# Patient Record
Sex: Male | Born: 1970 | Race: White | Hispanic: No | Marital: Married | Smoking: Former smoker
Health system: Southern US, Community
[De-identification: ages and names within clinical notes are randomized; demographics above are authoritative.]

## PROBLEM LIST (undated history)

## (undated) DIAGNOSIS — I509 Heart failure, unspecified: Secondary | ICD-10-CM

## (undated) DIAGNOSIS — M1990 Unspecified osteoarthritis, unspecified site: Secondary | ICD-10-CM

## (undated) DIAGNOSIS — J45909 Unspecified asthma, uncomplicated: Secondary | ICD-10-CM

## (undated) DIAGNOSIS — I219 Acute myocardial infarction, unspecified: Secondary | ICD-10-CM

## (undated) DIAGNOSIS — I2510 Atherosclerotic heart disease of native coronary artery without angina pectoris: Secondary | ICD-10-CM

## (undated) DIAGNOSIS — E785 Hyperlipidemia, unspecified: Secondary | ICD-10-CM

## (undated) DIAGNOSIS — K279 Peptic ulcer, site unspecified, unspecified as acute or chronic, without hemorrhage or perforation: Secondary | ICD-10-CM

## (undated) HISTORY — DX: Heart failure, unspecified: I50.9

## (undated) HISTORY — DX: Unspecified osteoarthritis, unspecified site: M19.90

## (undated) HISTORY — PX: ESOPHAGOGASTRODUODENOSCOPY: SHX1529

## (undated) HISTORY — PX: KNEE SURGERY: SHX244

---

## 2009-03-08 ENCOUNTER — Emergency Department: Payer: Self-pay | Admitting: Emergency Medicine

## 2009-03-15 ENCOUNTER — Ambulatory Visit: Payer: Self-pay | Admitting: Internal Medicine

## 2009-03-19 ENCOUNTER — Ambulatory Visit: Payer: Self-pay | Admitting: Internal Medicine

## 2009-03-29 ENCOUNTER — Ambulatory Visit: Payer: Self-pay | Admitting: Gastroenterology

## 2009-09-04 DIAGNOSIS — K279 Peptic ulcer, site unspecified, unspecified as acute or chronic, without hemorrhage or perforation: Secondary | ICD-10-CM

## 2009-09-04 HISTORY — DX: Peptic ulcer, site unspecified, unspecified as acute or chronic, without hemorrhage or perforation: K27.9

## 2013-07-28 ENCOUNTER — Encounter (HOSPITAL_COMMUNITY): Payer: Self-pay | Admitting: Emergency Medicine

## 2013-07-28 ENCOUNTER — Inpatient Hospital Stay (HOSPITAL_COMMUNITY)
Admission: EM | Admit: 2013-07-28 | Discharge: 2013-07-30 | DRG: 282 | Disposition: A | Discharge status: Home / Self Care | Payer: 59 | Attending: Cardiology | Admitting: Cardiology

## 2013-07-28 ENCOUNTER — Emergency Department (HOSPITAL_COMMUNITY): Payer: 59

## 2013-07-28 DIAGNOSIS — Z87891 Personal history of nicotine dependence: Secondary | ICD-10-CM

## 2013-07-28 DIAGNOSIS — R51 Headache: Secondary | ICD-10-CM | POA: Diagnosis not present

## 2013-07-28 DIAGNOSIS — I214 Non-ST elevation (NSTEMI) myocardial infarction: Principal | ICD-10-CM

## 2013-07-28 DIAGNOSIS — I10 Essential (primary) hypertension: Secondary | ICD-10-CM | POA: Diagnosis present

## 2013-07-28 DIAGNOSIS — J45909 Unspecified asthma, uncomplicated: Secondary | ICD-10-CM | POA: Diagnosis present

## 2013-07-28 DIAGNOSIS — I2510 Atherosclerotic heart disease of native coronary artery without angina pectoris: Secondary | ICD-10-CM | POA: Diagnosis present

## 2013-07-28 DIAGNOSIS — Z8711 Personal history of peptic ulcer disease: Secondary | ICD-10-CM

## 2013-07-28 DIAGNOSIS — Z8249 Family history of ischemic heart disease and other diseases of the circulatory system: Secondary | ICD-10-CM

## 2013-07-28 DIAGNOSIS — E785 Hyperlipidemia, unspecified: Secondary | ICD-10-CM

## 2013-07-28 HISTORY — DX: Hyperlipidemia, unspecified: E78.5

## 2013-07-28 HISTORY — DX: Peptic ulcer, site unspecified, unspecified as acute or chronic, without hemorrhage or perforation: K27.9

## 2013-07-28 HISTORY — DX: Atherosclerotic heart disease of native coronary artery without angina pectoris: I25.10

## 2013-07-28 HISTORY — DX: Unspecified asthma, uncomplicated: J45.909

## 2013-07-28 LAB — BASIC METABOLIC PANEL
CO2: 25 mEq/L (ref 19–32)
Calcium: 9.5 mg/dL (ref 8.4–10.5)
Creatinine, Ser: 1.07 mg/dL (ref 0.50–1.35)
GFR calc Af Amer: >90 mL/min (ref >90)
GFR calc non Af Amer: 83 mL/min — ABNORMAL LOW (ref >90)
Glucose, Bld: 81 mg/dL (ref 70–99)
Potassium: 4.1 mEq/L (ref 3.5–5.1)
Sodium: 137 mEq/L (ref 135–145)

## 2013-07-28 LAB — APTT: aPTT: 30 seconds (ref 24–37)

## 2013-07-28 LAB — PROTIME-INR
INR: 0.92 (ref 0.00–1.49)
Prothrombin Time: 12.2 seconds (ref 11.6–15.2)

## 2013-07-28 LAB — CBC
HCT: 43 % (ref 39.0–52.0)
Hemoglobin: 15.4 g/dL (ref 13.0–17.0)
MCH: 34.4 pg — ABNORMAL HIGH (ref 26.0–34.0)
MCHC: 35.8 g/dL (ref 30.0–36.0)
RBC: 4.48 MIL/uL (ref 4.22–5.81)

## 2013-07-28 LAB — POCT I-STAT TROPONIN I

## 2013-07-28 LAB — MRSA PCR SCREENING: MRSA by PCR: NEGATIVE

## 2013-07-28 LAB — TROPONIN I: Troponin I: 9.53 ng/mL (ref <0.30)

## 2013-07-28 MED ORDER — NITROGLYCERIN IN D5W 200-5 MCG/ML-% IV SOLN
10.0000 ug/min | INTRAVENOUS | Status: DC
  Administered 2013-07-28: 5 ug/min via INTRAVENOUS
  Filled 2013-07-28: qty 250

## 2013-07-28 MED ORDER — DIAZEPAM 5 MG PO TABS
5.0000 mg | ORAL_TABLET | ORAL | Status: DC
  Administered 2013-07-29: 5 mg via ORAL
  Filled 2013-07-28: qty 1

## 2013-07-28 MED ORDER — ALPRAZOLAM 0.25 MG PO TABS
0.2500 mg | ORAL_TABLET | Freq: Two times a day (BID) | ORAL | Status: DC | PRN
  Administered 2013-07-29: 0.25 mg via ORAL
  Filled 2013-07-28: qty 1

## 2013-07-28 MED ORDER — CARVEDILOL 3.125 MG PO TABS
3.1250 mg | ORAL_TABLET | Freq: Two times a day (BID) | ORAL | Status: DC
  Administered 2013-07-29: 3.125 mg via ORAL
  Filled 2013-07-28 (×3): qty 1

## 2013-07-28 MED ORDER — NITROGLYCERIN 0.4 MG SL SUBL: 0.4000 mg | SUBLINGUAL_TABLET | SUBLINGUAL | Status: DC | PRN

## 2013-07-28 MED ORDER — HEPARIN BOLUS VIA INFUSION
4000.0000 [IU] | Freq: Once | INTRAVENOUS | Status: AC
  Administered 2013-07-28: 4000 [IU] via INTRAVENOUS
  Filled 2013-07-28: qty 4000

## 2013-07-28 MED ORDER — ATORVASTATIN CALCIUM 80 MG PO TABS
80.0000 mg | ORAL_TABLET | Freq: Every day | ORAL | Status: DC
  Administered 2013-07-29: 80 mg via ORAL
  Filled 2013-07-28 (×2): qty 1

## 2013-07-28 MED ORDER — HEPARIN (PORCINE) IN NACL 100-0.45 UNIT/ML-% IJ SOLN
1600.0000 [IU]/h | INTRAMUSCULAR | Status: DC
  Administered 2013-07-28: 1300 [IU]/h via INTRAVENOUS
  Administered 2013-07-29: 1600 [IU]/h via INTRAVENOUS
  Filled 2013-07-28 (×4): qty 250

## 2013-07-28 MED ORDER — ASPIRIN 81 MG PO CHEW
324.0000 mg | CHEWABLE_TABLET | Freq: Once | ORAL | Status: AC
  Administered 2013-07-28: 324 mg via ORAL
  Filled 2013-07-28: qty 4

## 2013-07-28 MED ORDER — SODIUM CHLORIDE 0.9 % IV SOLN: 250.0000 mL | INTRAVENOUS | Status: DC | PRN

## 2013-07-28 MED ORDER — SODIUM CHLORIDE 0.9 % IJ SOLN
3.0000 mL | Freq: Two times a day (BID) | INTRAMUSCULAR | Status: DC
  Administered 2013-07-28 – 2013-07-29 (×2): 3 mL via INTRAVENOUS

## 2013-07-28 MED ORDER — ASPIRIN EC 81 MG PO TBEC
81.0000 mg | DELAYED_RELEASE_TABLET | Freq: Every day | ORAL | Status: DC
  Administered 2013-07-30: 81 mg via ORAL
  Filled 2013-07-28: qty 1

## 2013-07-28 MED ORDER — ACETAMINOPHEN 325 MG PO TABS
650.0000 mg | ORAL_TABLET | ORAL | Status: DC | PRN
  Administered 2013-07-29 – 2013-07-30 (×5): 650 mg via ORAL
  Filled 2013-07-28 (×5): qty 2

## 2013-07-28 MED ORDER — SODIUM CHLORIDE 0.9 % IV SOLN
1.0000 mL/kg/h | INTRAVENOUS | Status: DC
  Administered 2013-07-29: 1 mL/kg/h via INTRAVENOUS

## 2013-07-28 MED ORDER — SODIUM CHLORIDE 0.9 % IJ SOLN
3.0000 mL | Freq: Two times a day (BID) | INTRAMUSCULAR | Status: DC
  Administered 2013-07-29: 08:00:00 via INTRAVENOUS
  Administered 2013-07-29: 3 mL via INTRAVENOUS
  Administered 2013-07-30: 10:00:00 via INTRAVENOUS

## 2013-07-28 MED ORDER — SODIUM CHLORIDE 0.9 % IJ SOLN: 3.0000 mL | INTRAMUSCULAR | Status: DC | PRN

## 2013-07-28 MED ORDER — ZOLPIDEM TARTRATE 5 MG PO TABS: 5.0000 mg | ORAL_TABLET | Freq: Every evening | ORAL | Status: DC | PRN

## 2013-07-28 MED ORDER — NITROGLYCERIN 0.4 MG SL SUBL
0.4000 mg | SUBLINGUAL_TABLET | SUBLINGUAL | Status: DC | PRN
  Administered 2013-07-28: 0.4 mg via SUBLINGUAL
  Filled 2013-07-28: qty 25

## 2013-07-28 MED ORDER — ONDANSETRON HCL 4 MG/2ML IJ SOLN
4.0000 mg | Freq: Four times a day (QID) | INTRAMUSCULAR | Status: DC | PRN
  Administered 2013-07-29: 4 mg via INTRAVENOUS
  Filled 2013-07-28: qty 2

## 2013-07-28 MED ORDER — ASPIRIN 81 MG PO CHEW: 324.0000 mg | CHEWABLE_TABLET | Freq: Once | ORAL | Status: DC

## 2013-07-28 MED ORDER — ASPIRIN 81 MG PO CHEW
324.0000 mg | CHEWABLE_TABLET | ORAL | Status: AC
  Administered 2013-07-29: 324 mg via ORAL
  Filled 2013-07-28: qty 4

## 2013-07-28 NOTE — Progress Notes (Signed)
ANTICOAGULATION CONSULT NOTE - Initial Consult  Pharmacy Consult for heparin Indication: chest pain/ACS  Allergies  Allergen Reactions  . Erythromycin     Patient Measurements: Height: 5\' 11"  (180.3 cm) Weight: 225 lb (102.059 kg) IBW/kg (Calculated) : 75.3 Heparin Dosing Weight: 96 kg  Vital Signs: Temp: 98 F (36.7 C) (11/24 1254) Temp src: Oral (11/24 1254) BP: 132/113 mmHg (11/24 1512) Pulse Rate: 58 (11/24 1512)  Labs:  Recent Labs  07/28/13 1257 07/28/13 1426  HGB  --  15.4  HCT  --  43.0  PLT  --  255  CREATININE 1.07  --     Estimated Creatinine Clearance: 108.3 ml/min (by C-G formula based on Cr of 1.07).   Medical History: Past Medical History  Diagnosis Date  . Asthma   . Peptic ulcer     Medications:  See med rec  Assessment: Patient is a 42 y.o M presented to the ED with c/o CP.  To start heparin for r/o ACS.   Goal of Therapy:  Heparin level 0.3-0.7 units/ml Monitor platelets by anticoagulation protocol: Yes   Plan:  1) heparin 4000 units IV x1 bolus, then drip at 1300 units/hr 2) check 6 hour heparin level  Penn Grissett P 07/28/2013,3:47 PM

## 2013-07-28 NOTE — H&P (Signed)
History and Physical   Patient ID: Henry Howe MRN: [id number], DOB/AGE: [birth year] 42 y.o. Date of Encounter: 07/28/2013  Primary Physician: No primary provider on file. - Last physical 3 years ago. Gets cholesterol tested yearly  Primary Cardiologist: new  Chief Complaint:  Chest pain  HPI: Henry Howe is a 42 y.o. male with no history of CAD. He woke at 3:30 am with chest pain that radiated into his left arm. 8-9/10. An elephant on his chest. A little SOB, no N&V, some diaphoresis but he has that frequently at night. Burped some, felt better. Able to go back to sleep after 1-1/2 hrs, got up at 6 am and went to work. While at work, felt general malaise, lack of energy. He contacted some friends in EMS and came to the ER as they requested.   He has had intermittent pain since then, one episode responded to SL NTG. 2/10 now. No palpitations, no previous history of chest pain. 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 No problems with previous CT scans using dye.   Past Medical History  Diagnosis Date  . Asthma   . Peptic ulcer 2011  . Hyperlipidemia     Surgical History:  Past Surgical History  Procedure Laterality Date  . Knee surgery      x 3  . Esophagogastroduodenoscopy      Hx ulcers     I have reviewed the patient's current medications. Prior to Admission medications   None   Scheduled Meds:  Continuous Infusions: . heparin 1,300 Units/hr (07/28/13 1623)   PRN Meds:.nitroGLYCERIN  Allergies:  Allergies  Allergen Reactions  . Erythromycin     Liver failure    History   Social History  . Marital Status: Married    Spouse Name: N/A    Number of Children: N/A  . Years of Education: N/A   Occupational History  . [employer] Division Director    Social History Main Topics  . Smoking status: Former Smoker -- 1.00 packs/day    Quit date: 09/04/1993  . Smokeless tobacco: Never Used  . Alcohol Use: Yes     Comment: occ single drink   . Drug Use: No  . Sexual Activity: Not on file   Other Topics Concern  . Not on file   Social History Narrative   Walks trails frequently at work. Lives with wife, 3 children.    No family history on file. Family Status  Relation Status Death Age  . Father Deceased 27    4 uncles died before 20 w/ MI. no Hx CAD  . Mother Alive     no hx CAD    Review of Systems: Has back problems, does not take medications. Full 14-point review of systems otherwise negative except as noted above.  Physical Exam: Blood pressure 146/97, pulse 65, temperature 98 F (36.7 C), temperature source Oral, resp. rate 19, height 5\' 11"  (1.803 m), weight 225 lb (102.059 kg), SpO2 97.00%. General: Well developed, well nourished,male in no acute distress. Head: Normocephalic, atraumatic, sclera non-icteric, no xanthomas, nares are without discharge. Dentition: moderate Neck: No carotid bruits. JVD not elevated. No thyromegally Lungs: Good expansion bilaterally. without wheezes or rhonchi.  Heart: Regular rate and rhythm with S1 S2.  No S3 or S4.  No murmur, no rubs, or gallops appreciated. Abdomen: Soft, non-tender, non-distended with normoactive bowel sounds. No hepatomegaly. No rebound/guarding. No obvious abdominal masses. Msk:  Strength and tone appear normal for age. No joint  deformities or effusions, no spine or costo-vertebral angle tenderness. Extremities: No clubbing or cyanosis. No edema.  Distal pedal pulses are 2+ in 4 extrem Neuro: Alert and oriented X 3. Moves all extremities spontaneously. No focal deficits noted. Psych:  Responds to questions appropriately with a normal affect. Skin: No rashes or lesions noted  Labs:   Lab Results  Component Value Date   WBC 11.2* 07/28/2013   HGB 15.4 07/28/2013   HCT 43.0 07/28/2013   MCV 96.0 07/28/2013   PLT 255 07/28/2013     Recent Labs Lab 07/28/13 1257  NA 137  K 4.1  CL 101  CO2 25  BUN 11  CREATININE 1.07  CALCIUM 9.5  GLUCOSE 81      Recent Labs  07/28/13 1532  TROPIPOC 1.36*   Radiology/Studies: Dg Chest 2 View 07/28/2013   CLINICAL DATA:  Left-sided chest discomfort.  EXAM: CHEST  2 VIEW  COMPARISON:  None.  FINDINGS: The lungs are borderline hypoinflated. There is no focal infiltrate. The cardiac silhouette is normal in size. The pulmonary vascularity is not engorged. The mediastinum is normal in width. There is no pleural effusion or pneumothorax. The observed portions of the bony thorax exhibit no acute abnormality. The observed portions of the upper abdomen exhibit no acute abnormalities.  IMPRESSION: There is no evidence of active cardiopulmonary abnormality. Specifically there are no findings to suggest CHF.   Electronically Signed   By: David  [place]   On: 07/28/2013 13:20     ECG: 07/28/2013 SR Vent. rate 67 BPM PR interval 138 ms QRS duration 96 ms QT/QTc 400/422 ms P-R-T axes 48 0 24  ASSESSMENT AND PLAN:  Principal Problem:   NSTEMI, initial episode of care - admit, continue to cycle enzymes, cath in am. The risks and benefits of a cardiac catheterization including, but not limited to, death, stroke, MI, kidney damage and bleeding were discussed with the patient who indicates understanding and agrees to proceed.   Active Problems:   Hyperlipidemia - add a statin, ck profile in am.   HTN - no previous dx, BP/HR high enough for low-dose Coreg, will add.  Melida Quitter, PA-C 07/28/2013 4:52 PM Beeper [phone]   Patient interviewed and examined. Agree with assessment and plan as stated above.  Obese WM who has not seen a PCP in several years and apparently has had lipids done yearly and was told his total chol was 400(?) but no medical therapy prescribed, presented to the ER with complaints of CP since yesterday that comes and goes and has ruled in for NSTEMI with elevated troponin.  He has a history of remote tobacco abuse.  He will be admitted for NTEMI and started in IV Heparin and NTG  gtts.  Will continue to cycle cardiac enzymes until they peak.  He has a history of PUD in the past but no recent GI bleed.  Will heme check stools.  Start beta blocker and statin. Plan left heart cath in am by Dr. Anne Fu.  Cardiac catheterization was discussed with the patient fully including risks on myocardial infarction, death, stroke, bleeding, arrhythmia, dye allergy, renal insufficiency or bleeding.  All patient questions and concerns were discussed and the patient understands and is willing to proceed.

## 2013-07-28 NOTE — ED Notes (Signed)
Pt states chest pain woke him up this am around 3. Pt states increased pain on left side and hurts when laying on that side. Pt denies other associated symptoms with chest pain. Respiration equal and unlabored. Pt states chest pain decreases with activity. Skin warm and dry. NAD noted. Pt states feeling "blah" today. Pt states he has not had much energy today. Pt c/o headache. Pt denies change in vision, denies numbness and tingling.

## 2013-07-28 NOTE — ED Notes (Signed)
Pt states that he had chest pain that woke him at 0300 with feeling of elephant sitting on his chest.  Pt has radiation down left arm.

## 2013-07-28 NOTE — ED Notes (Signed)
MD at Bedside.

## 2013-07-28 NOTE — ED Provider Notes (Signed)
CSN: [id number]     Arrival date & time 07/28/13  1247 History   First MD Initiated Contact with Patient 07/28/13 1326     Chief Complaint  Patient presents with  . Chest Pain   (Consider location/radiation/quality/duration/timing/severity/associated sxs/prior Treatment) HPI Henry Howe is a 42 y.o. male who presents to emergency department with complaint of chest pain and left arm pain. Patient states the pain woke him up in the middle of the night. States pain comes and goes. States pain feels like "elephant sitting on my chest." States pain is random, is not exertional. Reports some shortness of breath is associated with the pain and some "hot flashes." Patient denies any dizziness or nausea. States left arm also feels "tingly and numb." Symptoms are present. He did not take medications for this. He denies any history of the same. He denies any known cardiac history. States has history of peptic ulcers but this does not feel the same. States has family history of cardiac problems and history of elevated cholesterol but has not needed any medications. Denies cough or fever. Denies recent travel or surgeries.  Past Medical History  Diagnosis Date  . Asthma   . Peptic ulcer    Past Surgical History  Procedure Laterality Date  . Knee surgery     No family history on file. History  Substance Use Topics  . Smoking status: Former Games developer  . Smokeless tobacco: Not on file  . Alcohol Use: Yes     Comment: occ    Review of Systems  Constitutional: Negative for fever, chills and fatigue.  Respiratory: Positive for chest tightness and shortness of breath. Negative for cough.   Cardiovascular: Positive for chest pain. Negative for palpitations and leg swelling.  Gastrointestinal: Negative for nausea, vomiting, abdominal pain, diarrhea and abdominal distention.  Genitourinary: Negative for dysuria, urgency, frequency and hematuria.  Musculoskeletal: Negative for arthralgias, myalgias,  neck pain and neck stiffness.  Skin: Negative for rash.  Allergic/Immunologic: Negative for immunocompromised state.  Neurological: Negative for dizziness, weakness, light-headedness, numbness and headaches.  All other systems reviewed and are negative.    Allergies  Erythromycin  Home Medications  No current outpatient prescriptions on file. BP 141/95  Pulse 60  Temp(Src) 98 F (36.7 C) (Oral)  Resp 13  Wt 225 lb (102.059 kg)  SpO2 97% Physical Exam  Nursing note and vitals reviewed. Constitutional: He appears well-developed and well-nourished. No distress.  HENT:  Head: Normocephalic and atraumatic.  Eyes: Conjunctivae are normal.  Neck: Neck supple.  Cardiovascular: Normal rate, regular rhythm and normal heart sounds.   Pulmonary/Chest: Effort normal. No respiratory distress. He has no wheezes. He has no rales. He exhibits no tenderness.  Abdominal: Soft. Bowel sounds are normal. He exhibits no distension. There is no tenderness. There is no rebound.  Musculoskeletal: He exhibits no edema.  Neurological: He is alert.  Skin: Skin is warm and dry.    ED Course  Procedures (including critical care time) Labs Review Labs Reviewed  BASIC METABOLIC PANEL - Abnormal; Notable for the following:    GFR calc non Af Amer 83 (*)    All other components within normal limits  CBC - Abnormal; Notable for the following:    WBC 11.2 (*)    MCH 34.4 (*)    All other components within normal limits  POCT I-STAT TROPONIN I - Abnormal; Notable for the following:    Troponin i, poc 1.36 (*)    All other components within normal limits  PROTIME-INR  APTT  TROPONIN I   Imaging Review Dg Chest 2 View  07/28/2013   CLINICAL DATA:  Left-sided chest discomfort.  EXAM: CHEST  2 VIEW  COMPARISON:  None.  FINDINGS: The lungs are borderline hypoinflated. There is no focal infiltrate. The cardiac silhouette is normal in size. The pulmonary vascularity is not engorged. The mediastinum is  normal in width. There is no pleural effusion or pneumothorax. The observed portions of the bony thorax exhibit no acute abnormality. The observed portions of the upper abdomen exhibit no acute abnormalities.  IMPRESSION: There is no evidence of active cardiopulmonary abnormality. Specifically there are no findings to suggest CHF.   Electronically Signed   By: David  [place]   On: 07/28/2013 13:20    EKG Interpretation    Date/Time:  Monday July 28 2013 12:52:36 EST Ventricular Rate:  67 PR Interval:  138 QRS Duration: 96 QT Interval:  400 QTC Calculation: 422 R Axis:   0 Text Interpretation:  Sinus rhythm with Premature atrial complexes Otherwise normal ECG sr with PAC Abnormal ekg Confirmed by Gerhard Munch  MD ([id number]) on 07/28/2013 3:47:24 PM            MDM   1. NSTEMI (non-ST elevated myocardial infarction)   2. Hyperlipidemia   .  Pt with atypical CP since last night. Comes and goes. Non exertional. He does have risk factors for ACS including elevated cholesterol and family history of heart disease. He is also living a not very healthy life style after hearing him talk about his diet and habbits. Labs and CXR ordered. ECG unremarkable. Will monitor. Aspirin 325mg  ordered po.   3:49 PM Troponin back elevated at 1.36. Will consult cardiology. Heparin ordered. Pt already received aspirin. His current pain is 2/10, will try SL nitro.   4:20 PM Spoke with Cardiology, will come see.   Lottie Mussel, PA-C 07/30/13 0002

## 2013-07-29 ENCOUNTER — Encounter (HOSPITAL_COMMUNITY)
Admission: EM | Disposition: A | Discharge status: Home / Self Care | Payer: Self-pay | Source: Home / Self Care | Attending: Cardiology

## 2013-07-29 ENCOUNTER — Ambulatory Visit (HOSPITAL_COMMUNITY): Admission: RE | Admit: 2013-07-29 | Payer: 59 | Source: Ambulatory Visit | Admitting: Cardiology

## 2013-07-29 ENCOUNTER — Encounter (HOSPITAL_COMMUNITY): Payer: Self-pay | Admitting: Cardiology

## 2013-07-29 DIAGNOSIS — I2510 Atherosclerotic heart disease of native coronary artery without angina pectoris: Secondary | ICD-10-CM

## 2013-07-29 HISTORY — DX: Atherosclerotic heart disease of native coronary artery without angina pectoris: I25.10

## 2013-07-29 HISTORY — PX: LEFT HEART CATHETERIZATION WITH CORONARY ANGIOGRAM: SHX5451

## 2013-07-29 LAB — CBC
HCT: 41.4 % (ref 39.0–52.0)
Hemoglobin: 14.6 g/dL (ref 13.0–17.0)
MCHC: 35.3 g/dL (ref 30.0–36.0)
Platelets: 242 10*3/uL (ref 150–400)

## 2013-07-29 LAB — TROPONIN I: Troponin I: 3.16 ng/mL (ref <0.30)

## 2013-07-29 LAB — COMPREHENSIVE METABOLIC PANEL
Albumin: 3.5 g/dL (ref 3.5–5.2)
BUN: 10 mg/dL (ref 6–23)
Creatinine, Ser: 1.05 mg/dL (ref 0.50–1.35)
GFR calc Af Amer: >90 mL/min (ref >90)
Glucose, Bld: 112 mg/dL — ABNORMAL HIGH (ref 70–99)
Total Protein: 7.1 g/dL (ref 6.0–8.3)

## 2013-07-29 LAB — LIPID PANEL
Cholesterol: 230 mg/dL — ABNORMAL HIGH (ref 0–200)
LDL Cholesterol: 165 mg/dL — ABNORMAL HIGH (ref 0–99)
Triglycerides: 72 mg/dL (ref <150)

## 2013-07-29 LAB — HEMOGLOBIN A1C
Hgb A1c MFr Bld: 5.9 % — ABNORMAL HIGH (ref <5.7)
Mean Plasma Glucose: 123 mg/dL — ABNORMAL HIGH (ref <117)

## 2013-07-29 LAB — TSH: TSH: 1.577 u[IU]/mL (ref 0.350–4.500)

## 2013-07-29 SURGERY — LEFT HEART CATHETERIZATION WITH CORONARY ANGIOGRAM
Anesthesia: LOCAL

## 2013-07-29 MED ORDER — ONDANSETRON HCL 4 MG/2ML IJ SOLN: 4.0000 mg | Freq: Four times a day (QID) | INTRAMUSCULAR | Status: DC | PRN

## 2013-07-29 MED ORDER — FENTANYL CITRATE 0.05 MG/ML IJ SOLN
INTRAMUSCULAR | Status: AC
  Filled 2013-07-29: qty 2

## 2013-07-29 MED ORDER — HEPARIN SODIUM (PORCINE) 1000 UNIT/ML IJ SOLN
INTRAMUSCULAR | Status: AC
  Filled 2013-07-29: qty 1

## 2013-07-29 MED ORDER — DIAZEPAM 5 MG PO TABS
ORAL_TABLET | ORAL | Status: AC
  Filled 2013-07-29: qty 1

## 2013-07-29 MED ORDER — HEPARIN BOLUS VIA INFUSION
3000.0000 [IU] | Freq: Once | INTRAVENOUS | Status: AC
  Administered 2013-07-29: 3000 [IU] via INTRAVENOUS
  Filled 2013-07-29: qty 3000

## 2013-07-29 MED ORDER — MIDAZOLAM HCL 2 MG/2ML IJ SOLN
INTRAMUSCULAR | Status: AC
  Filled 2013-07-29: qty 2

## 2013-07-29 MED ORDER — VERAPAMIL HCL 2.5 MG/ML IV SOLN
INTRAVENOUS | Status: AC
  Filled 2013-07-29: qty 2

## 2013-07-29 MED ORDER — CLOPIDOGREL BISULFATE 75 MG PO TABS
75.0000 mg | ORAL_TABLET | Freq: Every day | ORAL | Status: DC
  Administered 2013-07-30: 75 mg via ORAL
  Filled 2013-07-29: qty 1

## 2013-07-29 MED ORDER — ACETAMINOPHEN 325 MG PO TABS: 650.0000 mg | ORAL_TABLET | ORAL | Status: DC | PRN

## 2013-07-29 MED ORDER — LIDOCAINE HCL (PF) 1 % IJ SOLN
INTRAMUSCULAR | Status: AC
  Filled 2013-07-29: qty 30

## 2013-07-29 MED ORDER — HEPARIN (PORCINE) IN NACL 2-0.9 UNIT/ML-% IJ SOLN
INTRAMUSCULAR | Status: AC
  Filled 2013-07-29: qty 1000

## 2013-07-29 MED ORDER — NITROGLYCERIN 0.2 MG/ML ON CALL CATH LAB
INTRAVENOUS | Status: AC
  Filled 2013-07-29: qty 1

## 2013-07-29 MED ORDER — CLOPIDOGREL BISULFATE 300 MG PO TABS
300.0000 mg | ORAL_TABLET | Freq: Once | ORAL | Status: AC
  Administered 2013-07-29: 300 mg via ORAL
  Filled 2013-07-29: qty 1

## 2013-07-29 MED ORDER — SODIUM CHLORIDE 0.9 % IV SOLN
1.0000 mL/kg/h | INTRAVENOUS | Status: AC
  Administered 2013-07-29: 1 mL/kg/h via INTRAVENOUS

## 2013-07-29 MED ORDER — CARVEDILOL 6.25 MG PO TABS
6.2500 mg | ORAL_TABLET | Freq: Two times a day (BID) | ORAL | Status: DC
  Administered 2013-07-29: 6.25 mg via ORAL
  Administered 2013-07-30: 3.125 mg via ORAL
  Filled 2013-07-29 (×4): qty 1

## 2013-07-29 NOTE — Care Management Note (Signed)
    Page 1 of 1   07/29/2013     9:14:11 AM   CARE MANAGEMENT NOTE 07/29/2013  Patient:  Henry Howe, Henry Howe   Account Number:  [id number]  Date Initiated:  07/29/2013  Documentation initiated by:  Junius Creamer  Subjective/Objective Assessment:   adm w mi     Action/Plan:   lives w wife   Anticipated DC Date:     Anticipated DC Plan:  HOME/SELF CARE         Choice offered to / List presented to:             Status of service:   Medicare Important Message given?   (If response is "NO", the following Medicare IM given date fields will be blank) Date Medicare IM given:   Date Additional Medicare IM given:    Discharge Disposition:    Per UR Regulation:  Reviewed for med. necessity/level of care/duration of stay  If discussed at Long Length of Stay Meetings, dates discussed:    Comments:

## 2013-07-29 NOTE — Progress Notes (Signed)
On call MD (Dr. Anderson Callas) called re pt's increasing Troponin, current one being 9.53 Pt asymptomatic and asleep. No new orders received. Will monitor closely.

## 2013-07-29 NOTE — Progress Notes (Signed)
ANTICOAGULATION CONSULT NOTE - Follow Up Consult  Pharmacy Consult for heparin Indication: NSTEMI  Labs:  Recent Labs  07/28/13 1257 07/28/13 1426 07/28/13 1630 07/28/13 2230 07/29/13 0026  HGB  --  15.4  --   --   --   HCT  --  43.0  --   --   --   PLT  --  255  --   --   --   APTT  --   --  30  --   --   LABPROT  --   --  12.2  --   --   INR  --   --  0.92  --   --   HEPARINUNFRC  --   --  <0.10*  --  0.19*  CREATININE 1.07  --   --   --   --   TROPONINI  --   --  3.16* 9.53*  --     Assessment: 42yo male subtherapeutic on heparin with initial dosing for NSTEMI.  Goal of Therapy:  Heparin level 0.3-0.7 units/ml   Plan:  Will rebolus with heparin 3000 units x1 and increase gtt by 3 units/kg/hr to 1600 units/hr and check level in 6hr.  Vernard Gambles, PharmD, BCPS  07/29/2013,2:26 AM

## 2013-07-29 NOTE — CV Procedure (Signed)
    CARDIAC CATHETERIZATION  PROCEDURE:  Left heart catheterization with selective coronary angiography, left ventriculogram via the radial artery approach.  INDICATIONS:  42 year old male with strong family history of coronary artery disease, uncles, with hyperlipidemia , quit smoking 20 years ago, nondiabetic on no current medications who presented with non-ST elevation myocardial infarction troponin 11 at peak. Cardiologist-Dr. Armanda Magic.  The risks, benefits, and details of the procedure were explained to the patient, including possibilities of stroke, heart attack, death, renal impairment, arterial damage, bleeding.  The patient verbalized understanding and wanted to proceed.  Informed written consent was obtained.  PROCEDURE TECHNIQUE:  Allen's test was performed pre-and post procedure and was normal. The right radial artery site was prepped and draped in a sterile fashion. One percent lidocaine was used for local anesthesia. Using the modified Seldinger technique a 5 French hydrophilic sheath was inserted into the radial artery without difficulty. 3 mg of verapamil was administered via the sheath. A Judkins right #4 catheter with the guidance of a Versicore wire was placed in the right coronary cusp and selectively cannulated the right coronary artery. After traversing the aortic arch, 5000 units of heparin IV was administered. A Judkins left #3.5 catheter was used to selectively cannulate the left main artery. Multiple views with hand injection of Omnipaque were obtained. Catheter a pigtail catheter was used to cross into the left ventricle, hemodynamics were obtained, and a left ventriculogram was performed in the RAO position with power injection. Following the procedure, sheath was removed, patient was hemodynamically stable, hemostasis was maintained with a Terumo T band.   CONTRAST:  Total of 70 ml.    FLOUROSCOPY TIME: 3.3 min.  COMPLICATIONS:  None.    HEMODYNAMICS:  Aortic  pressure was 110/86mmHg; LV systolic pressure was ; LVEDP .  There was no gradient between the left ventricle and aorta.    ANGIOGRAPHIC DATA:    Left main: Widely patent, no angiographically significant disease, branches into LAD as well as circumflex  Left anterior descending (LAD): Minor luminal irregularities noted, to diagonal branches  Circumflex artery (CIRC): There is a mid obtuse marginal branch occlusion with subtle diagnosed in native vessel proximally 15 mm after occlusion. Fairly small caliber vessel. Moderate sized ramus branch with mild luminal irregularities.  Right coronary artery (RCA): Distal RCA/proximal posterior lateral branch with 80-90% stenosis.  LEFT VENTRICULOGRAM:  Left ventricular angiogram was done in the 30 RAO projection and revealed normal left ventricular wall motion and systolic function with an estimated ejection fraction of 60%.   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 80-90% proximal posterior lateral/distal RCA lesion. Minor luminal irregularities elsewhere. Normal left ventricular systolic function.  LVEDP 8 mmHg.  Ejection fraction 60%.  RECOMMENDATION:  Discussed findings with Dr. [place]. Since chest discomfort began at 3 AM yesterday and resolved in early afternoon and obtuse marginal is occluded, continue with aggressive medical management. If refractory angina occurs despite medical therapy, consider further intervention. I will keep him here today for continued monitoring, optimization of medication then likely discharge tomorrow.

## 2013-07-29 NOTE — Interval H&P Note (Signed)
Discussed with him earlier this am.  History and Physical Interval Note:  07/29/2013 9:14 AM  Cleda Mccreedy  has presented today for surgery, with the diagnosis of ANGINA  The various methods of treatment have been discussed with the patient and family. After consideration of risks, benefits and other options for treatment, the patient has consented to  Procedure(s): LEFT HEART CATHETERIZATION WITH CORONARY ANGIOGRAM (N/A) as a surgical intervention .  The patient's history has been reviewed, patient examined, no change in status, stable for surgery.  I have reviewed the patient's chart and labs.  Questions were answered to the patient's satisfaction.    Cath Lab Visit (complete for each Cath Lab visit)  Clinical Evaluation Leading to the Procedure:   ACS: yes YES  Non-ACS:    Anginal Classification: CCS IV  Anti-ischemic medical therapy: Minimal Therapy (1 class of medications)  Non-Invasive Test Results: No non-invasive testing performed  Prior CABG: No previous CABG         Verdell Kincannon

## 2013-07-30 ENCOUNTER — Inpatient Hospital Stay (HOSPITAL_COMMUNITY): Payer: 59

## 2013-07-30 DIAGNOSIS — I10 Essential (primary) hypertension: Secondary | ICD-10-CM

## 2013-07-30 LAB — HEMOGLOBIN A1C: Mean Plasma Glucose: 114 mg/dL (ref <117)

## 2013-07-30 LAB — CBC
HCT: 41.9 % (ref 39.0–52.0)
MCH: 34.3 pg — ABNORMAL HIGH (ref 26.0–34.0)
MCHC: 35.1 g/dL (ref 30.0–36.0)
MCV: 97.9 fL (ref 78.0–100.0)
RDW: 13 % (ref 11.5–15.5)

## 2013-07-30 MED ORDER — NITROGLYCERIN 0.4 MG SL SUBL: 0.4000 mg | SUBLINGUAL_TABLET | SUBLINGUAL | Status: DC | PRN

## 2013-07-30 MED ORDER — CARVEDILOL 3.125 MG PO TABS
3.1250 mg | ORAL_TABLET | Freq: Two times a day (BID) | ORAL | Status: DC
  Filled 2013-07-30 (×2): qty 1

## 2013-07-30 MED ORDER — ATORVASTATIN CALCIUM 80 MG PO TABS: 80.0000 mg | ORAL_TABLET | Freq: Every day | ORAL | Status: DC

## 2013-07-30 MED ORDER — CLOPIDOGREL BISULFATE 75 MG PO TABS: 75.0000 mg | ORAL_TABLET | Freq: Every day | ORAL | Status: DC

## 2013-07-30 MED ORDER — ASPIRIN 81 MG PO TBEC: 81.0000 mg | DELAYED_RELEASE_TABLET | Freq: Every day | ORAL | Status: AC

## 2013-07-30 MED ORDER — CARVEDILOL 6.25 MG PO TABS: 3.1250 mg | ORAL_TABLET | Freq: Two times a day (BID) | ORAL | Status: DC

## 2013-07-30 MED ORDER — CARVEDILOL 6.25 MG PO TABS: 6.2500 mg | ORAL_TABLET | Freq: Two times a day (BID) | ORAL | Status: DC

## 2013-07-30 NOTE — Discharge Summary (Signed)
Patient ID: Henry Howe MRN: [id number] DOB/AGE: 1970/09/16 42 y.o.  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  INDICATIONS: 42 year old male with strong family history of coronary artery disease, uncles, with hyperlipidemia , quit smoking 20 years ago, nondiabetic on no current medications who presented with non-ST elevation myocardial infarction troponin 11 at peak. Cardiologist-Dr. Armanda Magic.  The risks, benefits, and details of the procedure were explained to the patient, including possibilities of stroke, heart attack, death, renal impairment, arterial damage, bleeding. The patient verbalized understanding and wanted to proceed. Informed written consent was obtained.  PROCEDURE TECHNIQUE: Allen's test was performed pre-and post procedure and was normal. The right radial artery site was prepped and draped in a sterile fashion. One percent lidocaine was used for local anesthesia. Using the modified Seldinger technique a 5 French hydrophilic sheath was inserted into the radial artery without difficulty. 3 mg of verapamil was administered via the sheath. A Judkins right #4 catheter with the guidance of a Versicore wire was placed in the right coronary cusp and selectively cannulated the right coronary artery. After traversing the aortic arch, 5000 units of heparin IV was administered. A Judkins left #3.5 catheter was used to selectively cannulate the left main artery. Multiple views with hand injection of Omnipaque were obtained. Catheter a pigtail catheter was used to cross into the left ventricle, hemodynamics were obtained, and a  left ventriculogram was performed in the RAO position with power injection. Following the procedure, sheath was removed, patient was hemodynamically stable, hemostasis was maintained with a Terumo T band.  CONTRAST: Total of 70 ml.  FLOUROSCOPY TIME: 3.3 min.  COMPLICATIONS: None.  HEMODYNAMICS: Aortic pressure was 110/85mmHg; LV systolic pressure was ; LVEDP . There was no gradient between the left ventricle and aorta.  ANGIOGRAPHIC DATA:  Left main: Widely patent, no angiographically significant disease, branches into LAD as well as circumflex  Left anterior descending (LAD): Minor luminal irregularities noted, to diagonal branches  Circumflex artery (CIRC): There is a mid obtuse marginal branch occlusion with subtle diagnosed in native vessel proximally 15 mm after occlusion. Fairly small caliber vessel. Moderate sized ramus branch with mild luminal irregularities.  Right coronary artery (RCA): Distal RCA/proximal posterior lateral branch with 80-90% stenosis.  LEFT VENTRICULOGRAM: Left ventricular angiogram was done in the 30 RAO projection and revealed normal left ventricular wall motion and systolic function with an estimated ejection fraction of 60%.  IMPRESSIONS:  1. Occluded obtuse marginal branch-culprit vessel for MI. 2. 80-90% proximal posterior lateral/distal RCA lesion. 3. Minor luminal irregularities elsewhere.   Consults: None  Hospital Course: Henry Howe is a 42 y.o. male with no history of CAD. He woke at 3:30 am with chest pain that radiated into his left arm. 8-9/10. An elephant on his chest. A little SOB, no N&V, some diaphoresis but he has that frequently at night. Burped some, felt better. Able to go back to sleep after 1-1/2 hrs, got up at 6 am and went to work. While at work, felt general malaise, lack of energy. He contacted some friends in EMS and came to the ER as they requested.  He has had intermittent pain since then, one episode responded to SL  NTG. 2/10 now.  No palpitations, no previous history of chest pain. 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 No problems with previous CT scans using dye. He was admitted to [facility] and ruled in for NSTEMI.  He underwent cath which showed an occluded branch OM and 80-90% PL branch stenosis with normal LVF.  After review of films with Interventionalist, it was felt that best management at this time was medical therapy.  He was started on ASA/Plaivix/Coreg and statin.  He was found to have dyslipidemia and HTN and hemoglobin A1C is pending at time of dictation. On the day of discharge he was complaining of a severe headache so a head CT without contrast was obtained which was normal.    Discharge Exam:    General appearance: alert Resp: clear to auscultation bilaterally Cardio: regular rate and rhythm, S1, S2 normal, no murmur, click, rub or gallop GI: soft, non-tender; bowel sounds normal; no masses,  no organomegaly Extremities: extremities normal, atraumatic, no cyanosis or edema Labs:   Lab Results  Component Value Date   WBC 15.5* 07/30/2013   HGB 14.7 07/30/2013   HCT 41.9 07/30/2013   MCV 97.9 07/30/2013   PLT 238 07/30/2013     Recent Labs Lab 07/29/13 0554  NA 138  K 3.9  CL 104  CO2 23  BUN 10  CREATININE 1.05  CALCIUM 8.7  PROT 7.1  BILITOT 0.6  ALKPHOS 77  ALT 53  AST 82*  GLUCOSE 112*   Lab Results  Component Value Date   TROPONINI 11.82* 07/29/2013    Lab Results  Component Value Date   CHOL 230* 07/29/2013   Lab Results  Component Value Date   HDL 51 07/29/2013   Lab Results  Component Value Date   LDLCALC 165* 07/29/2013   Lab Results  Component Value Date   TRIG 72 07/29/2013   Lab Results  Component Value Date   CHOLHDL 4.5 07/29/2013   No results found for this basename: LDLDIRECT      Radiology:CLINICAL DATA: Left-sided chest discomfort.  EXAM:  CHEST 2 VIEW  COMPARISON: None.  FINDINGS:  The lungs are borderline hypoinflated.  There is no focal infiltrate.  The cardiac silhouette is normal in size. The pulmonary vascularity  is not engorged. The mediastinum is normal in width. There is no  pleural effusion or pneumothorax. The observed portions of the bony  thorax exhibit no acute abnormality. The observed portions of the  upper abdomen exhibit no acute abnormalities.  IMPRESSION:  There is no evidence of active cardiopulmonary abnormality.  Specifically there are no findings to suggest CHF.   EKG:NSR with minimal voltage criteria for LVH  FOLLOW UP PLANS AND APPOINTMENTS    Medication List         aspirin 81 MG EC tablet  Take 1 tablet (81 mg total) by mouth daily.     atorvastatin 80 MG tablet  Commonly known as:  LIPITOR  Take 1 tablet (80 mg total) by mouth daily at 6 PM.     carvedilol 6.25 MG tablet  Commonly known as:  COREG  Take 1/2 tablet (3.125 mg total) by mouth 2 (two) times daily with a meal.     clopidogrel 75 MG tablet  Commonly known as:  PLAVIX  Take 1 tablet (75 mg total) by mouth daily with breakfast.     nitroGLYCERIN 0.4 MG SL tablet  Commonly known as:  NITROSTAT  Place 1 tablet (0.4 mg total) under the tongue every 5 (five) minutes x  3 doses as needed for chest pain.       Follow-up Information   Call Quintella Reichert, MD. (call for an appot with Dr. Mayford Knife in 2 weeks)    Specialty:  Cardiology   Contact information:   [address] [place] [address] [phone]       BRING ALL MEDICATIONS WITH YOU TO FOLLOW UP APPOINTMENTS  Time spent with patient to include physician time: 35 minutes Signed: Quintella Reichert 07/30/2013, 7:31 AM

## 2013-07-30 NOTE — Progress Notes (Signed)
Pt. D/C home with wife via wheelchair to car.  D/C instructions given and pt stated understanding using teach back.

## 2013-08-01 NOTE — ED Provider Notes (Signed)
  This was a shared visit with a mid-level provided (NP or PA).  Throughout the patient's course I was available for consultation/collaboration.  I saw the ECG (if appropriate), relevant labs and studies - I agree with the interpretation.  On my exam the patient was in no distress.  His pain was minimal.  With his description of pain since yesterday, troponin elevation, there is concern for ongoing coronary ischemia.  Patient had heparin initiated, was admitted for further evaluation and management.      Gerhard Munch, MD 08/01/13 [phone]

## 2013-08-13 ENCOUNTER — Encounter: Payer: Self-pay | Admitting: General Surgery

## 2013-08-14 ENCOUNTER — Encounter: Payer: Self-pay | Admitting: Cardiology

## 2013-08-14 ENCOUNTER — Encounter: Payer: Self-pay | Admitting: General Surgery

## 2013-08-14 ENCOUNTER — Ambulatory Visit (INDEPENDENT_AMBULATORY_CARE_PROVIDER_SITE_OTHER): Payer: 59 | Admitting: Cardiology

## 2013-08-14 ENCOUNTER — Telehealth: Payer: Self-pay | Admitting: Cardiology

## 2013-08-14 VITALS — BP 110/86 | HR 79 | Ht 71.0 in | Wt 220.0 lb

## 2013-08-14 DIAGNOSIS — I10 Essential (primary) hypertension: Secondary | ICD-10-CM

## 2013-08-14 DIAGNOSIS — I2510 Atherosclerotic heart disease of native coronary artery without angina pectoris: Secondary | ICD-10-CM

## 2013-08-14 DIAGNOSIS — E785 Hyperlipidemia, unspecified: Secondary | ICD-10-CM

## 2013-08-14 LAB — BASIC METABOLIC PANEL
BUN: 13 mg/dL (ref 6–23)
CO2: 29 mEq/L (ref 19–32)
Calcium: 9.5 mg/dL (ref 8.4–10.5)
Creatinine, Ser: 1.2 mg/dL (ref 0.4–1.5)
Glucose, Bld: 85 mg/dL (ref 70–99)
Sodium: 140 mEq/L (ref 135–145)

## 2013-08-14 NOTE — Telephone Encounter (Signed)
New Message  Pt requests a call back to discuss is options on eliminatating stress ( any type of medication)// Please call back to discuss

## 2013-08-14 NOTE — Patient Instructions (Signed)
Your physician recommends that you continue on your current medications as directed. Please refer to the Current Medication list given to you today.  Your physician recommends that you go to the lab today for a BMET Panel  Your physician recommends that you return for lab work on 09/11/2013 for Fasting Lipid and ALT panels  Your physician wants you to follow-up in: 6 Months with Dr Sherlyn Lick will receive a reminder letter in the mail two months in advance. If you don't receive a letter, please call our office to schedule the follow-up appointment.

## 2013-08-14 NOTE — Progress Notes (Signed)
[address] [place], [place]  [address] Phone: [phone] Fax:  [phone]  Date:  08/14/2013   ID:  Henry Howe, DOB 07-10-1971, MRN [id number]  PCP:  No primary provider on file.  Cardiologist:  Armanda Magic, MD     History of Present Illness: Henry Howe is a 42 y.o. male with a recent history of NSTEMI and underwent cath showing an occluded OM and 80-90% PL branch stenosis with normal LVF.  After reivew of films with interventionalist medical management was continued.  He presents today for followup.  He denies any further CP, LE edema, dizziness, palpitations or syncope.  He has been fatigued some and when he gets tired he may feel more SOB.     Wt Readings from Last 3 Encounters:  08/14/13 220 lb (99.791 kg)  07/30/13 216 lb 7.9 oz (98.2 kg)  07/30/13 216 lb 7.9 oz (98.2 kg)     Past Medical History  Diagnosis Date  . Asthma   . Peptic ulcer 2011  . Hyperlipidemia   . CAD in native artery 07/29/2013    Cath 07/29/13: Occluded OM, 80% posterior lateral branch, mild luminal irregularities of LAD, normal EF-medical management    Current Outpatient Prescriptions  Medication Sig Dispense Refill  . aspirin EC 81 MG EC tablet Take 1 tablet (81 mg total) by mouth daily.      Marland Kitchen atorvastatin (LIPITOR) 80 MG tablet Take 1 tablet (80 mg total) by mouth daily at 6 PM.  30 tablet  11  . carvedilol (COREG) 6.25 MG tablet Take 0.5 tablets (3.125 mg total) by mouth 2 (two) times daily with a meal.  60 tablet  11  . clopidogrel (PLAVIX) 75 MG tablet Take 1 tablet (75 mg total) by mouth daily with breakfast.  30 tablet  11  . nitroGLYCERIN (NITROSTAT) 0.4 MG SL tablet Place 1 tablet (0.4 mg total) under the tongue every 5 (five) minutes x 3 doses as needed for chest pain.  25 tablet  12   No current facility-administered medications for this visit.    Allergies:    Allergies  Allergen Reactions  . Erythromycin     Liver failure    Social History:  The  patient  reports that he quit smoking about 19 years ago. He has never used smokeless tobacco. He reports that he drinks alcohol. He reports that he does not use illicit drugs.   Family History:  The patient's family history is not on file.   ROS:  Please see the history of present illness.      All other systems reviewed and negative.   PHYSICAL EXAM: VS:  BP 110/86  Pulse 79  Ht 5\' 11"  (1.803 m)  Wt 220 lb (99.791 kg)  BMI 30.70 kg/m2 Well nourished, well developed, in no acute distress HEENT: normal Neck: no JVD Cardiac:  normal S1, S2; RRR; no murmur Lungs:  clear to auscultation bilaterally, no wheezing, rhonchi or rales Abd: soft, nontender, no hepatomegaly Ext: no edema Skin: warm and dry Neuro:  CNs 2-12 intact, no focal abnormalities noted       ASSESSMENT AND PLAN:  1. ASCAD with recent NSTEMI on medical management with occluded OM and 80-90% PL branch stenosis.  He denies any anginal pain.    - continue ASA/Plavix 2. Dyslipidemia  - continue atorvastatin  - check fasting lipid in 4 weeks with ALT 3. HTN - fairly well controlled  - continue carvedilol   Followup with me  in 6 months  Signed, Armanda Magic, MD 08/14/2013 2:24 PM

## 2013-08-14 NOTE — Telephone Encounter (Signed)
Needs to go through PCP

## 2013-08-14 NOTE — Telephone Encounter (Signed)
Dr Mayford Knife we would not prescribe any meds to help with stress correct? Pt would have to go thru a PCP?

## 2013-08-15 ENCOUNTER — Encounter: Payer: Self-pay | Admitting: General Surgery

## 2013-08-15 NOTE — Telephone Encounter (Signed)
Pt made aware. We also faxed over pts referral for cardiac rehab. I made him aware that we did that as well.

## 2013-09-02 ENCOUNTER — Encounter (HOSPITAL_COMMUNITY)
Admission: RE | Admit: 2013-09-02 | Discharge: 2013-09-02 | Disposition: A | Discharge status: Home / Self Care | Payer: 59 | Source: Ambulatory Visit | Attending: Cardiology | Admitting: Cardiology

## 2013-09-02 NOTE — Progress Notes (Signed)
Cardiac Rehab Medication Review by a Pharmacist  Does the patient  feel that his/her medications are working for him/her?  yes  Has the patient been experiencing any side effects to the medications prescribed?  No - He does notice some nausea after eating foods that are "bad" for him, but does not know if it's drug-related. He says he noticed this after he started these medications, though none of these are typically associated with that.   Does the patient measure his/her own blood pressure or blood glucose at home?  no   Does the patient have any problems obtaining medications due to transportation or finances?   no  Understanding of regimen: good Understanding of indications: good Potential of compliance: fair    Pharmacist comments: Henry Howe demonstrates a good understanding of his medication regimen and how to take his medications. He does admit to missing doses occasionally, especially recently. Henry Howe describes no issues obtaining his medications.   Henry Howe, PharmD Clinical Pharmacist-Resident Pager: [phone] Pharmacy: [phone] 09/02/2013 9:06 AM

## 2013-09-08 ENCOUNTER — Encounter (HOSPITAL_COMMUNITY)
Admission: RE | Admit: 2013-09-08 | Discharge: 2013-09-08 | Disposition: A | Discharge status: Home / Self Care | Payer: 59 | Source: Ambulatory Visit | Attending: Cardiology | Admitting: Cardiology

## 2013-09-08 DIAGNOSIS — I10 Essential (primary) hypertension: Secondary | ICD-10-CM | POA: Insufficient documentation

## 2013-09-08 DIAGNOSIS — J45909 Unspecified asthma, uncomplicated: Secondary | ICD-10-CM | POA: Insufficient documentation

## 2013-09-08 DIAGNOSIS — Z8249 Family history of ischemic heart disease and other diseases of the circulatory system: Secondary | ICD-10-CM | POA: Insufficient documentation

## 2013-09-08 DIAGNOSIS — Z5189 Encounter for other specified aftercare: Secondary | ICD-10-CM | POA: Insufficient documentation

## 2013-09-08 DIAGNOSIS — E785 Hyperlipidemia, unspecified: Secondary | ICD-10-CM | POA: Insufficient documentation

## 2013-09-08 DIAGNOSIS — I2510 Atherosclerotic heart disease of native coronary artery without angina pectoris: Secondary | ICD-10-CM | POA: Insufficient documentation

## 2013-09-08 DIAGNOSIS — Z87891 Personal history of nicotine dependence: Secondary | ICD-10-CM | POA: Insufficient documentation

## 2013-09-08 DIAGNOSIS — I252 Old myocardial infarction: Secondary | ICD-10-CM | POA: Insufficient documentation

## 2013-09-08 NOTE — Progress Notes (Signed)
Pt in today for his first day of exercise at 6:45 am Phase II class.  Pt tolerated light exercise with no difficulties but did complain of some chronic back pain particularly on the treadmill.  Medications review, Monitor showed Sr with no noted ectopy.   PHQ2 score  0.

## 2013-09-10 ENCOUNTER — Encounter (HOSPITAL_COMMUNITY)
Admission: RE | Admit: 2013-09-10 | Discharge: 2013-09-10 | Disposition: A | Discharge status: Home / Self Care | Payer: 59 | Source: Ambulatory Visit | Attending: Cardiology | Admitting: Cardiology

## 2013-09-11 ENCOUNTER — Other Ambulatory Visit (INDEPENDENT_AMBULATORY_CARE_PROVIDER_SITE_OTHER): Payer: 59

## 2013-09-11 DIAGNOSIS — E785 Hyperlipidemia, unspecified: Secondary | ICD-10-CM

## 2013-09-11 LAB — LIPID PANEL
CHOLESTEROL: 186 mg/dL (ref 0–200)
HDL: 42.4 mg/dL (ref >39.00)
LDL CALC: 124 mg/dL — AB (ref 0–99)
Total CHOL/HDL Ratio: 4
Triglycerides: 99 mg/dL (ref 0.0–149.0)
VLDL: 19.8 mg/dL (ref 0.0–40.0)

## 2013-09-11 LAB — ALT: ALT: 47 U/L (ref 0–53)

## 2013-09-12 ENCOUNTER — Encounter (HOSPITAL_COMMUNITY)
Admission: RE | Admit: 2013-09-12 | Discharge: 2013-09-12 | Disposition: A | Discharge status: Home / Self Care | Payer: 59 | Source: Ambulatory Visit | Attending: Cardiology | Admitting: Cardiology

## 2013-09-15 ENCOUNTER — Encounter (HOSPITAL_COMMUNITY)
Admission: RE | Admit: 2013-09-15 | Discharge: 2013-09-15 | Disposition: A | Discharge status: Home / Self Care | Payer: 59 | Source: Ambulatory Visit | Attending: Cardiology | Admitting: Cardiology

## 2013-09-17 ENCOUNTER — Encounter (HOSPITAL_COMMUNITY)
Admission: RE | Admit: 2013-09-17 | Discharge: 2013-09-17 | Disposition: A | Discharge status: Home / Self Care | Payer: 59 | Source: Ambulatory Visit | Attending: Cardiology | Admitting: Cardiology

## 2013-09-17 NOTE — Progress Notes (Signed)
I have reviewed home exercise with Marcello Moores. The patient was advised to walk 2-4 days per week outside of CRP II for 30-45 minutes per day.  Pt asked about being released to do more physical work and he was informed that we determine activity level progression based off current MET levels and we will re-evaluate every two weeks.  Pt will also complete one additional day of hand weights outside of CRP II.  Progression of exercise prescription was discussed.  Reviewed THR, pulse, RPE, sign and symptoms, NTG use and when to call 911 or MD.  Pt voiced understanding.  Archie Endo, MS, ACSM RCEP 09/17/2013 8:09 AM

## 2013-09-19 ENCOUNTER — Encounter (HOSPITAL_COMMUNITY)
Admission: RE | Admit: 2013-09-19 | Discharge: 2013-09-19 | Disposition: A | Discharge status: Home / Self Care | Payer: 59 | Source: Ambulatory Visit | Attending: Cardiology | Admitting: Cardiology

## 2013-09-22 ENCOUNTER — Encounter (HOSPITAL_COMMUNITY): Payer: 59

## 2013-09-24 ENCOUNTER — Encounter (HOSPITAL_COMMUNITY)
Admission: RE | Admit: 2013-09-24 | Discharge: 2013-09-24 | Disposition: A | Discharge status: Home / Self Care | Payer: 59 | Source: Ambulatory Visit | Attending: Cardiology | Admitting: Cardiology

## 2013-09-26 ENCOUNTER — Encounter (HOSPITAL_COMMUNITY)
Admission: RE | Admit: 2013-09-26 | Discharge: 2013-09-26 | Disposition: A | Discharge status: Home / Self Care | Payer: 59 | Source: Ambulatory Visit | Attending: Cardiology | Admitting: Cardiology

## 2013-09-29 ENCOUNTER — Encounter (HOSPITAL_COMMUNITY): Payer: 59

## 2013-10-01 ENCOUNTER — Encounter (HOSPITAL_COMMUNITY)
Admission: RE | Admit: 2013-10-01 | Discharge: 2013-10-01 | Disposition: A | Discharge status: Home / Self Care | Payer: 59 | Source: Ambulatory Visit | Attending: Cardiology | Admitting: Cardiology

## 2013-10-03 ENCOUNTER — Encounter (HOSPITAL_COMMUNITY)
Admission: RE | Admit: 2013-10-03 | Discharge: 2013-10-03 | Disposition: A | Discharge status: Home / Self Care | Payer: 59 | Source: Ambulatory Visit | Attending: Cardiology | Admitting: Cardiology

## 2013-10-06 ENCOUNTER — Encounter (HOSPITAL_COMMUNITY)
Admission: RE | Admit: 2013-10-06 | Discharge: 2013-10-06 | Disposition: A | Discharge status: Home / Self Care | Payer: 59 | Source: Ambulatory Visit | Attending: Cardiology | Admitting: Cardiology

## 2013-10-06 DIAGNOSIS — J45909 Unspecified asthma, uncomplicated: Secondary | ICD-10-CM | POA: Insufficient documentation

## 2013-10-06 DIAGNOSIS — I10 Essential (primary) hypertension: Secondary | ICD-10-CM | POA: Insufficient documentation

## 2013-10-06 DIAGNOSIS — Z5189 Encounter for other specified aftercare: Secondary | ICD-10-CM | POA: Insufficient documentation

## 2013-10-06 DIAGNOSIS — Z8249 Family history of ischemic heart disease and other diseases of the circulatory system: Secondary | ICD-10-CM | POA: Insufficient documentation

## 2013-10-06 DIAGNOSIS — I214 Non-ST elevation (NSTEMI) myocardial infarction: Secondary | ICD-10-CM | POA: Insufficient documentation

## 2013-10-06 DIAGNOSIS — Z8711 Personal history of peptic ulcer disease: Secondary | ICD-10-CM | POA: Insufficient documentation

## 2013-10-06 DIAGNOSIS — E785 Hyperlipidemia, unspecified: Secondary | ICD-10-CM | POA: Insufficient documentation

## 2013-10-06 DIAGNOSIS — Z87891 Personal history of nicotine dependence: Secondary | ICD-10-CM | POA: Insufficient documentation

## 2013-10-06 DIAGNOSIS — I2510 Atherosclerotic heart disease of native coronary artery without angina pectoris: Secondary | ICD-10-CM | POA: Insufficient documentation

## 2013-10-08 ENCOUNTER — Telehealth: Payer: Self-pay | Admitting: Cardiology

## 2013-10-08 ENCOUNTER — Encounter (HOSPITAL_COMMUNITY)
Admission: RE | Admit: 2013-10-08 | Discharge: 2013-10-08 | Disposition: A | Discharge status: Home / Self Care | Payer: 59 | Source: Ambulatory Visit | Attending: Cardiology | Admitting: Cardiology

## 2013-10-08 DIAGNOSIS — E785 Hyperlipidemia, unspecified: Secondary | ICD-10-CM

## 2013-10-08 MED ORDER — EZETIMIBE 10 MG PO TABS: 10.0000 mg | ORAL_TABLET | Freq: Every day | ORAL | Status: DC

## 2013-10-08 NOTE — Telephone Encounter (Signed)
Message copied by Theda SersSTEGALL, Rhiannon Sassaman H on Wed Oct 08, 2013  2:51 PM ------      Message from: SMART, Gaspar SkeetersJEREMY G      Created: Fri Sep 12, 2013  4:49 PM       RF:  NSTEMI (07/2013), family h/o CAD - LDL goal < 70, non-HDL goal < 100      Meds:  Lipitor 80 mg qd started in hospital 07/2013 following MI      LDL was 165 in hospital (07/2013), and down to 124 mg/dL now.  LDL commonly falsely low during ACS, so baseline LDL could have been higher than 165 mg/dL.  LDL still remains well above target.  Will add Zetia to lipitor 80 mg qd and recheck panel in 2 months.        Plan:      1.  Add Zetia 10 mg qd for further LDL reduction.      2.  Continue Lipitor 80 mg qd.      3.  Low fat diet and exercise when possible.      4.  Recheck lipid panel and hepatic panel in 2 months.      Please notify patient, update meds, and set up labs. Thanks. ------

## 2013-10-08 NOTE — Telephone Encounter (Signed)
Pt notified. Zetia sent in and labs ordered.

## 2013-10-10 ENCOUNTER — Encounter (HOSPITAL_COMMUNITY)
Admission: RE | Admit: 2013-10-10 | Discharge: 2013-10-10 | Disposition: A | Discharge status: Home / Self Care | Payer: 59 | Source: Ambulatory Visit | Attending: Cardiology | Admitting: Cardiology

## 2013-10-10 NOTE — Progress Notes (Signed)
Pt frustrated with the lack of weight loss despite making many changes in diet and activity.  Pt will plan to meet with Henry Howe on Monday after class for consultation.  It is difficult to leave from work to attend the group nutrition class on Tuesday at 10am.  Will confer with Marily MemosEdna.

## 2013-10-13 ENCOUNTER — Encounter (HOSPITAL_COMMUNITY)
Admission: RE | Admit: 2013-10-13 | Discharge: 2013-10-13 | Disposition: A | Discharge status: Home / Self Care | Payer: 59 | Source: Ambulatory Visit | Attending: Cardiology | Admitting: Cardiology

## 2013-10-13 NOTE — Progress Notes (Signed)
Pt rescheduled conference with Marily Memosdna, dietician for next Monday due to work commitment for today.  Alanson Alyarlette Carlton RN, BSN

## 2013-10-15 ENCOUNTER — Telehealth: Payer: Self-pay | Admitting: Cardiology

## 2013-10-15 ENCOUNTER — Encounter (HOSPITAL_COMMUNITY)
Admission: RE | Admit: 2013-10-15 | Discharge: 2013-10-15 | Disposition: A | Discharge status: Home / Self Care | Payer: 59 | Source: Ambulatory Visit | Attending: Cardiology | Admitting: Cardiology

## 2013-10-15 NOTE — Telephone Encounter (Signed)
New message    Can't afford new medication Dr Mayford Knifeurner put him on---want something cheaper called in to [facility]/[address]

## 2013-10-16 NOTE — Telephone Encounter (Signed)
Any suggestions>?

## 2013-10-16 NOTE — Telephone Encounter (Signed)
Pt was put on Zetia. To Dr Mayford Knifeurner to Advise.

## 2013-10-16 NOTE — Telephone Encounter (Signed)
I discussed benefit of further LDL lowering with patient, but he tells me the cost of agents like Zetia or Welchol will cost too much and he can't do it.  We discussed the PCSK-9 inhibitor clinical trial, and he is interested in enrolling into this.  I have contacted [facility] as well, and will get him set up with them.  FYI to Dr. Mayford Knifeurner.

## 2013-10-17 ENCOUNTER — Encounter (HOSPITAL_COMMUNITY): Payer: 59

## 2013-10-20 ENCOUNTER — Encounter (HOSPITAL_COMMUNITY): Payer: 59

## 2013-10-22 ENCOUNTER — Encounter (HOSPITAL_COMMUNITY): Payer: 59

## 2013-10-24 ENCOUNTER — Encounter (HOSPITAL_COMMUNITY)
Admission: RE | Admit: 2013-10-24 | Discharge: 2013-10-24 | Disposition: A | Discharge status: Home / Self Care | Payer: 59 | Source: Ambulatory Visit | Attending: Cardiology | Admitting: Cardiology

## 2013-10-27 ENCOUNTER — Encounter (HOSPITAL_COMMUNITY)
Admission: RE | Admit: 2013-10-27 | Discharge: 2013-10-27 | Disposition: A | Discharge status: Home / Self Care | Payer: 59 | Source: Ambulatory Visit | Attending: Cardiology | Admitting: Cardiology

## 2013-10-27 NOTE — Progress Notes (Signed)
Henry Howe 43 y.o. male Nutrition Note Pt wanted to reschedule appointment with this writer again for Monday 3/2. Spoke with pt briefly. Pt frustrated with lack of wt loss despite dietary changes. Per pt, "I have gone from drinking 4L of Pepsi a day, down to 1/2 L Pepsi; I cut-out doughnuts, fast food burgers, and nuts and I haven't lost any weight." Pt reports eating salad with pineapple, tomato, cucumbers, cheese, and without salad dressing. Pt now eating 3 meals a day where he used to eat "less than 2." Rational for eating more frequently reviewed. Pt states he went back to his "old eating habits this weekend." Pt reports eating 9 doughnuts, 2 cheeseburgers, and steak on Friday because "the diet hasn't made a difference in my weight." This Clinical research associatewriter emphasized dietary changes important for overall health, not just weight. Per pt, "I've been following this diet since Thanksgiving and it hasn't made a difference." Will continue to help pt work through barriers to making and keeping dietary changes as well as to help promote wt loss. Continue client-centered nutrition education by RD as part of interdisciplinary care.  Monitor and evaluate progress toward nutrition goal with team.  Mickle PlumbEdna Afton Mikelson, M.Ed, RD, LDN, CDE 10/27/2013 8:07 AM

## 2013-10-29 ENCOUNTER — Encounter (HOSPITAL_COMMUNITY)
Admission: RE | Admit: 2013-10-29 | Discharge: 2013-10-29 | Disposition: A | Discharge status: Home / Self Care | Payer: 59 | Source: Ambulatory Visit | Attending: Cardiology | Admitting: Cardiology

## 2013-10-31 ENCOUNTER — Encounter (HOSPITAL_COMMUNITY): Payer: 59

## 2013-11-03 ENCOUNTER — Encounter (HOSPITAL_COMMUNITY): Payer: 59

## 2013-11-05 ENCOUNTER — Encounter (HOSPITAL_COMMUNITY)
Admission: RE | Admit: 2013-11-05 | Discharge: 2013-11-05 | Disposition: A | Discharge status: Home / Self Care | Payer: 59 | Source: Ambulatory Visit | Attending: Cardiology | Admitting: Cardiology

## 2013-11-05 DIAGNOSIS — I10 Essential (primary) hypertension: Secondary | ICD-10-CM | POA: Insufficient documentation

## 2013-11-05 DIAGNOSIS — I2510 Atherosclerotic heart disease of native coronary artery without angina pectoris: Secondary | ICD-10-CM | POA: Insufficient documentation

## 2013-11-05 DIAGNOSIS — Z5189 Encounter for other specified aftercare: Secondary | ICD-10-CM | POA: Insufficient documentation

## 2013-11-05 DIAGNOSIS — I214 Non-ST elevation (NSTEMI) myocardial infarction: Secondary | ICD-10-CM | POA: Insufficient documentation

## 2013-11-05 DIAGNOSIS — E785 Hyperlipidemia, unspecified: Secondary | ICD-10-CM | POA: Insufficient documentation

## 2013-11-05 DIAGNOSIS — Z87891 Personal history of nicotine dependence: Secondary | ICD-10-CM | POA: Insufficient documentation

## 2013-11-05 DIAGNOSIS — Z8711 Personal history of peptic ulcer disease: Secondary | ICD-10-CM | POA: Insufficient documentation

## 2013-11-05 DIAGNOSIS — Z8249 Family history of ischemic heart disease and other diseases of the circulatory system: Secondary | ICD-10-CM | POA: Insufficient documentation

## 2013-11-05 DIAGNOSIS — J45909 Unspecified asthma, uncomplicated: Secondary | ICD-10-CM | POA: Insufficient documentation

## 2013-11-07 ENCOUNTER — Encounter (HOSPITAL_COMMUNITY)
Admission: RE | Admit: 2013-11-07 | Discharge: 2013-11-07 | Disposition: A | Discharge status: Home / Self Care | Payer: 59 | Source: Ambulatory Visit | Attending: Cardiology | Admitting: Cardiology

## 2013-11-10 ENCOUNTER — Encounter (HOSPITAL_COMMUNITY): Payer: 59

## 2013-11-12 ENCOUNTER — Encounter (HOSPITAL_COMMUNITY)
Admission: RE | Admit: 2013-11-12 | Discharge: 2013-11-12 | Disposition: A | Discharge status: Home / Self Care | Payer: 59 | Source: Ambulatory Visit | Attending: Cardiology | Admitting: Cardiology

## 2013-11-12 NOTE — Progress Notes (Signed)
Henry Howe 43 y.o. male Nutrition Note Spoke with pt.  Nutrition Plan and Nutrition Survey goals reviewed with pt. Pt is not currently following the Therapeutic Lifestyle Changes diet. Pt has made significant changes in his diet since his MI. Pt states he wants to lose wt and feels his wt should have decreased due to "eating significantly less." Wt loss tips reviewed. Pt expressed understanding of the information reviewed. Pt aware of nutrition education classes offered.  Nutrition Diagnosis   Food-and nutrition-related knowledge deficit related to lack of exposure to information as related to diagnosis of: ? CVD    Obesity related to excessive energy intake as evidenced by a BMI of 33.2  Nutrition RX/ Estimated Daily Nutrition Needs for: wt loss  1700-2200 Kcal, 45-60 gm fat, 11-14 gm sat fat, 1.6-2.2 gm trans-fat, <1500 mg sodium   Nutrition Intervention   Pt's individual nutrition plan including cholesterol goals reviewed with pt.   Benefits of adopting Therapeutic Lifestyle Changes discussed when Medficts reviewed.   Pt to attend the Portion Distortion class - met; 10/29/13   Pt to attend the  ? Nutrition I class                         ? Nutrition II class   Pt given handouts for: ? Nutrition I class ? Nutrition II class   Continue client-centered nutrition education by RD, as part of interdisciplinary care.  Goal(s)   Pt to identify and limit food sources of saturated fat, trans fat, and cholesterol   Pt to identify food quantities necessary to achieve: ? wt loss to a goal wt of 200-218 lb (91.0-99.2 kg) at graduation from cardiac rehab.   Monitor and Evaluate progress toward nutrition goal with team. Nutrition Risk:  Low   Derek Mound, M.Ed, RD, LDN, CDE 11/12/2013 10:49 AM

## 2013-11-14 ENCOUNTER — Encounter (HOSPITAL_COMMUNITY)
Admission: RE | Admit: 2013-11-14 | Discharge: 2013-11-14 | Disposition: A | Discharge status: Home / Self Care | Payer: 59 | Source: Ambulatory Visit | Attending: Cardiology | Admitting: Cardiology

## 2013-11-17 ENCOUNTER — Encounter (HOSPITAL_COMMUNITY): Payer: 59

## 2013-11-19 ENCOUNTER — Encounter (HOSPITAL_COMMUNITY)
Admission: RE | Admit: 2013-11-19 | Discharge: 2013-11-19 | Disposition: A | Discharge status: Home / Self Care | Payer: 59 | Source: Ambulatory Visit | Attending: Cardiology | Admitting: Cardiology

## 2013-11-21 ENCOUNTER — Encounter (HOSPITAL_COMMUNITY)
Admission: RE | Admit: 2013-11-21 | Discharge: 2013-11-21 | Disposition: A | Discharge status: Home / Self Care | Payer: 59 | Source: Ambulatory Visit | Attending: Cardiology | Admitting: Cardiology

## 2013-11-24 ENCOUNTER — Encounter (HOSPITAL_COMMUNITY): Payer: 59

## 2013-11-26 ENCOUNTER — Encounter (HOSPITAL_COMMUNITY): Payer: 59

## 2013-11-28 ENCOUNTER — Encounter (HOSPITAL_COMMUNITY): Payer: 59

## 2013-12-01 ENCOUNTER — Encounter (HOSPITAL_COMMUNITY): Payer: Self-pay

## 2013-12-01 ENCOUNTER — Encounter (HOSPITAL_COMMUNITY): Payer: 59

## 2013-12-03 ENCOUNTER — Encounter (HOSPITAL_COMMUNITY): Payer: 59

## 2013-12-05 ENCOUNTER — Encounter (HOSPITAL_COMMUNITY): Payer: 59

## 2013-12-08 ENCOUNTER — Other Ambulatory Visit (INDEPENDENT_AMBULATORY_CARE_PROVIDER_SITE_OTHER): Payer: 59

## 2013-12-08 ENCOUNTER — Encounter (HOSPITAL_COMMUNITY): Payer: 59

## 2013-12-08 DIAGNOSIS — E785 Hyperlipidemia, unspecified: Secondary | ICD-10-CM

## 2013-12-08 LAB — LIPID PANEL
CHOL/HDL RATIO: 5
Cholesterol: 194 mg/dL (ref 0–200)
HDL: 38.5 mg/dL — ABNORMAL LOW (ref >39.00)
LDL CALC: 131 mg/dL — AB (ref 0–99)
Triglycerides: 121 mg/dL (ref 0.0–149.0)
VLDL: 24.2 mg/dL (ref 0.0–40.0)

## 2013-12-08 LAB — HEPATIC FUNCTION PANEL
ALT: 48 U/L (ref 0–53)
AST: 32 U/L (ref 0–37)
Albumin: 3.6 g/dL (ref 3.5–5.2)
Alkaline Phosphatase: 70 U/L (ref 39–117)
BILIRUBIN DIRECT: 0 mg/dL (ref 0.0–0.3)
BILIRUBIN TOTAL: 0.6 mg/dL (ref 0.3–1.2)
TOTAL PROTEIN: 7 g/dL (ref 6.0–8.3)

## 2013-12-10 ENCOUNTER — Encounter (HOSPITAL_COMMUNITY)
Admission: RE | Admit: 2013-12-10 | Discharge: 2013-12-10 | Disposition: A | Discharge status: Home / Self Care | Payer: 59 | Source: Ambulatory Visit | Attending: Cardiology | Admitting: Cardiology

## 2013-12-10 DIAGNOSIS — Z5189 Encounter for other specified aftercare: Secondary | ICD-10-CM | POA: Insufficient documentation

## 2013-12-10 DIAGNOSIS — E785 Hyperlipidemia, unspecified: Secondary | ICD-10-CM | POA: Insufficient documentation

## 2013-12-10 DIAGNOSIS — Z87891 Personal history of nicotine dependence: Secondary | ICD-10-CM | POA: Insufficient documentation

## 2013-12-10 DIAGNOSIS — J45909 Unspecified asthma, uncomplicated: Secondary | ICD-10-CM | POA: Insufficient documentation

## 2013-12-10 DIAGNOSIS — I10 Essential (primary) hypertension: Secondary | ICD-10-CM | POA: Insufficient documentation

## 2013-12-10 DIAGNOSIS — Z8249 Family history of ischemic heart disease and other diseases of the circulatory system: Secondary | ICD-10-CM | POA: Insufficient documentation

## 2013-12-10 DIAGNOSIS — Z8711 Personal history of peptic ulcer disease: Secondary | ICD-10-CM | POA: Insufficient documentation

## 2013-12-10 DIAGNOSIS — I214 Non-ST elevation (NSTEMI) myocardial infarction: Secondary | ICD-10-CM | POA: Insufficient documentation

## 2013-12-10 DIAGNOSIS — I2510 Atherosclerotic heart disease of native coronary artery without angina pectoris: Secondary | ICD-10-CM | POA: Insufficient documentation

## 2013-12-11 ENCOUNTER — Encounter: Payer: Self-pay | Admitting: General Surgery

## 2013-12-12 ENCOUNTER — Encounter (HOSPITAL_COMMUNITY): Payer: 59

## 2013-12-15 ENCOUNTER — Encounter (HOSPITAL_COMMUNITY)
Admission: RE | Admit: 2013-12-15 | Discharge: 2013-12-15 | Disposition: A | Discharge status: Home / Self Care | Payer: 59 | Source: Ambulatory Visit | Attending: Cardiology | Admitting: Cardiology

## 2013-12-17 ENCOUNTER — Encounter (HOSPITAL_COMMUNITY): Payer: 59

## 2013-12-19 ENCOUNTER — Encounter (HOSPITAL_COMMUNITY): Payer: 59

## 2013-12-22 ENCOUNTER — Encounter (HOSPITAL_COMMUNITY)
Admission: RE | Admit: 2013-12-22 | Discharge: 2013-12-22 | Disposition: A | Discharge status: Home / Self Care | Payer: 59 | Source: Ambulatory Visit | Attending: Cardiology | Admitting: Cardiology

## 2013-12-24 ENCOUNTER — Encounter (HOSPITAL_COMMUNITY)
Admission: RE | Admit: 2013-12-24 | Discharge: 2013-12-24 | Disposition: A | Discharge status: Home / Self Care | Payer: 59 | Source: Ambulatory Visit | Attending: Cardiology | Admitting: Cardiology

## 2013-12-26 ENCOUNTER — Encounter (HOSPITAL_COMMUNITY): Payer: 59

## 2013-12-29 ENCOUNTER — Encounter (HOSPITAL_COMMUNITY): Payer: 59

## 2013-12-31 ENCOUNTER — Encounter (HOSPITAL_COMMUNITY): Payer: 59

## 2014-01-02 ENCOUNTER — Encounter (HOSPITAL_COMMUNITY): Payer: 59

## 2014-01-05 ENCOUNTER — Encounter (HOSPITAL_COMMUNITY): Payer: 59

## 2014-01-07 ENCOUNTER — Encounter (HOSPITAL_COMMUNITY)
Admission: RE | Admit: 2014-01-07 | Discharge: 2014-01-07 | Disposition: A | Discharge status: Home / Self Care | Payer: 59 | Source: Ambulatory Visit | Attending: Cardiology | Admitting: Cardiology

## 2014-01-07 DIAGNOSIS — E785 Hyperlipidemia, unspecified: Secondary | ICD-10-CM | POA: Insufficient documentation

## 2014-01-07 DIAGNOSIS — Z8711 Personal history of peptic ulcer disease: Secondary | ICD-10-CM | POA: Insufficient documentation

## 2014-01-07 DIAGNOSIS — Z87891 Personal history of nicotine dependence: Secondary | ICD-10-CM | POA: Insufficient documentation

## 2014-01-07 DIAGNOSIS — I2510 Atherosclerotic heart disease of native coronary artery without angina pectoris: Secondary | ICD-10-CM | POA: Insufficient documentation

## 2014-01-07 DIAGNOSIS — I10 Essential (primary) hypertension: Secondary | ICD-10-CM | POA: Insufficient documentation

## 2014-01-07 DIAGNOSIS — Z5189 Encounter for other specified aftercare: Secondary | ICD-10-CM | POA: Insufficient documentation

## 2014-01-07 DIAGNOSIS — I214 Non-ST elevation (NSTEMI) myocardial infarction: Secondary | ICD-10-CM | POA: Insufficient documentation

## 2014-01-07 DIAGNOSIS — Z8249 Family history of ischemic heart disease and other diseases of the circulatory system: Secondary | ICD-10-CM | POA: Insufficient documentation

## 2014-01-07 DIAGNOSIS — J45909 Unspecified asthma, uncomplicated: Secondary | ICD-10-CM | POA: Insufficient documentation

## 2014-01-07 NOTE — Progress Notes (Signed)
Pt will graduate from the cardiac rehab phase II program on Friday 5/8 with 31 exercise sessions completed in 18 week period.  Pt's attendance was hampered by his work schedule and commitments.  Pt admits struggling with home exercise due to his work hours and demands.  Pt plan [facility] that he [facility] for work once a week but has not developed a plan for the remainder of the week.  Pt short term goal was not to have a MI.  During his participation in cardiac rehab he did not have a MI.  Pt will need reinforcement in making lifestyle changes to slow the progression of CAD. Pt long term goal is diet.  Pt has made some changes but has not fully adopted or embraced many of the heart heathy dietary changes.  Pt would benefit from continued support and reenforcement.  Pt repeat PHQ2 score 0. Medication list reconciled. Alanson Alyarlette Diego Delancey RN, BSN

## 2014-01-09 ENCOUNTER — Encounter (HOSPITAL_COMMUNITY)
Admission: RE | Admit: 2014-01-09 | Discharge: 2014-01-09 | Disposition: A | Discharge status: Home / Self Care | Payer: 59 | Source: Ambulatory Visit | Attending: Cardiology | Admitting: Cardiology

## 2014-01-16 ENCOUNTER — Telehealth (HOSPITAL_COMMUNITY): Payer: Self-pay | Admitting: Cardiac Rehabilitation

## 2014-01-16 NOTE — Telephone Encounter (Signed)
pc to pt to discuss his concerns about continued risk factors for coronary artery disease. Pt expresses concern about not being able to adequately make appropriate lifestyle modifications upon completion of cardiac rehab. Pt states he is not able to fit exercise into his busy work schedule. Pt also concerned about progression of disease with known existing blockages.   Pt concerns  were discussed with Dr. Mayford Knifeurner who requested pt schedule follow up appointment to discuss.  appt scheduled February 13, 2014 8:00am. Pt informed and verbalized understanding.

## 2014-02-13 ENCOUNTER — Ambulatory Visit (INDEPENDENT_AMBULATORY_CARE_PROVIDER_SITE_OTHER): Payer: 59 | Admitting: Cardiology

## 2014-02-13 ENCOUNTER — Encounter: Payer: Self-pay | Admitting: Cardiology

## 2014-02-13 VITALS — BP 116/82 | HR 70 | Ht 69.0 in | Wt 224.0 lb

## 2014-02-13 DIAGNOSIS — I2510 Atherosclerotic heart disease of native coronary artery without angina pectoris: Secondary | ICD-10-CM

## 2014-02-13 DIAGNOSIS — I10 Essential (primary) hypertension: Secondary | ICD-10-CM

## 2014-02-13 DIAGNOSIS — E785 Hyperlipidemia, unspecified: Secondary | ICD-10-CM

## 2014-02-13 DIAGNOSIS — I252 Old myocardial infarction: Secondary | ICD-10-CM

## 2014-02-13 NOTE — Patient Instructions (Signed)
Your physician recommends that you continue on your current medications as directed. Please refer to the Current Medication list given to you today.  Your physician wants you to follow-up in: 6 month ov You will receive a reminder letter in the mail two months in advance. If you don't receive a letter, please call our office to schedule the follow-up appointment.  

## 2014-02-13 NOTE — Progress Notes (Signed)
[address] [place], [place]  [address] Phone: [phone] Fax:  [phone]  Date:  02/13/2014   ID:  Henry Howe Sturdy, DOB Aug 08, 1971, MRN [id number]  PCP:  No primary provider on file.  Cardiologist:  Armanda Magicraci Capri Veals, MD     History of Present Illness: Henry Howe Kirtz is a 43 y.o. male with a recent history of NSTEMI and underwent cath showing an occluded OM and 80-90% PL branch stenosis with normal LVF. After reivew of films with interventionalist medical management was continued. He presents today for followup. He denies any further anginal CP, SOB, LE edema, dizziness, palpitations or syncope.He recently worked outside for about 4 hours in extreme heat with temp outside over 90 degrees and felt very weak and fatigued for about 3 days later.      Wt Readings from Last 3 Encounters:  02/13/14 224 lb (101.606 kg)  09/02/13 224 lb 10.4 oz (101.9 kg)  08/14/13 220 lb (99.791 kg)     Past Medical History  Diagnosis Date  . Asthma   . Peptic ulcer 2011  . Hyperlipidemia   . CAD in native artery 07/29/2013    Cath 07/29/13: Occluded OM, 80% posterior lateral branch, mild luminal irregularities of LAD, normal EF-medical management    Current Outpatient Prescriptions  Medication Sig Dispense Refill  . albuterol (ACCUNEB) 0.63 MG/3ML nebulizer solution Take 1 ampule by nebulization every 6 (six) hours as needed for wheezing or shortness of breath.      Marland Kitchen. aspirin EC 81 MG EC tablet Take 1 tablet (81 mg total) by mouth daily.      Marland Kitchen. atorvastatin (LIPITOR) 80 MG tablet Take 1 tablet (80 mg total) by mouth daily at 6 PM.  30 tablet  11  . carvedilol (COREG) 6.25 MG tablet Take 0.5 tablets (3.125 mg total) by mouth 2 (two) times daily with a meal.  60 tablet  11  . clopidogrel (PLAVIX) 75 MG tablet Take 1 tablet (75 mg total) by mouth daily with breakfast.  30 tablet  11  . nitroGLYCERIN (NITROSTAT) 0.4 MG SL tablet Place 1 tablet (0.4 mg total) under the tongue every 5  (five) minutes x 3 doses as needed for chest pain.  25 tablet  12   No current facility-administered medications for this visit.    Allergies:    Allergies  Allergen Reactions  . Erythromycin     Liver failure    Social History:  The patient  reports that he quit smoking about 20 years ago. He has never used smokeless tobacco. He reports that he drinks alcohol. He reports that he does not use illicit drugs.   Family History:  The patient's family history is not on file.   ROS:  Please see the history of present illness.      All other systems reviewed and negative.   PHYSICAL EXAM: VS:  BP 116/82  Pulse 70  Ht 5\' 9"  (1.753 m)  Wt 224 lb (101.606 kg)  BMI 33.06 kg/m2 Well nourished, well developed, in no acute distress HEENT: normal Neck: no JVD Cardiac:  normal S1, S2; RRR; no murmur Lungs:  clear to auscultation bilaterally, no wheezing, rhonchi or rales Abd: soft, nontender, no hepatomegaly Ext: no edema Skin: warm and dry Neuro:  CNs 2-12 intact, no focal abnormalities noted  ASSESSMENT AND PLAN:  1. ASCAD with recent NSTEMI on medical management with occluded OM and 80-90% PL branch stenosis. He denies any anginal pain.  - continue ASA/Plavix  2. Dyslipidemia - continue atorvastatin - he uses it 3-4 times weekly because he cannot tolerate it any more frequently - encouraged him to get into a routine walking program and follow a good low fat diet 3. HTN - fairly well controlled - continue carvedilol   Followup with me in 6 months       Signed, Armanda Magicraci Jonerik Sliker, MD 02/13/2014 8:05 AM

## 2014-07-20 ENCOUNTER — Telehealth: Payer: Self-pay | Admitting: Cardiology

## 2014-07-20 NOTE — Telephone Encounter (Signed)
Addressed in other phone note.

## 2014-07-20 NOTE — Telephone Encounter (Signed)
Pt st he felt bad all day yesterday, and his bottom blood pressure was up around 100. He said he had no energy and wanted to lay in bed all day. Today, his blood pressure was 123/86 and he feels much better this afternoon. Pt wants to know if Dr. Mayford Knifeurner wants to change any medications. BP readings in other phone call from 11/16.

## 2014-07-20 NOTE — Telephone Encounter (Signed)
New message           Pt has a question for dr turner / please give pt a call back

## 2014-07-20 NOTE — Telephone Encounter (Signed)
Increase coreg to 6.25mg  BID and check BP daily for a week and call with results

## 2014-07-20 NOTE — Telephone Encounter (Signed)
New  Message :  Pt was calling to speak to Dr. Mayford Knifeurner about his Dystolic numbers. He states yesterday's number were 126/105, last night 123/100,  This morning 116/97. Please call  Thanks

## 2014-07-21 NOTE — Telephone Encounter (Signed)
I spoke with the patient and made him aware of Dr. Norris Crossurner's recommendations to increase his coreg to 6.25 mg a whole table BID and monitor and record BP readings for a week and call with results. [place] in [place] for work and was questioning if he should go ahead and increase his medication. I have advised that he should go ahead and do this to see if he will feel better. I have asked that he go to a local [facility] or pharmacy and check his BP readings a few times prior to coming home on Thursday. He states he has been doing a little more strenuous work than what he normally does. He has had a little chest pain, but not significant. I have advised that he monitor his symptoms and call if he is not feeling any better or BP's are not controlled. I have also asked that he monitor his chest pain and if this becomes worse or persistent, he should be evaluated at the nearest ER. He voices understanding.

## 2014-07-22 ENCOUNTER — Other Ambulatory Visit: Payer: Self-pay

## 2014-07-22 MED ORDER — CARVEDILOL 6.25 MG PO TABS: 6.2500 mg | ORAL_TABLET | Freq: Two times a day (BID) | ORAL | Status: DC

## 2014-08-05 ENCOUNTER — Telehealth: Payer: Self-pay | Admitting: Cardiology

## 2014-08-05 DIAGNOSIS — I471 Supraventricular tachycardia, unspecified: Secondary | ICD-10-CM

## 2014-08-05 DIAGNOSIS — R000 Tachycardia, unspecified: Secondary | ICD-10-CM

## 2014-08-05 NOTE — Telephone Encounter (Signed)
Patient calling to report BP and HR: 11/17 BP 129/103 HR 112 11/18 BP 116/83 HR 92 11/19 BP 125/86 HR 73 11/20 BP 122/89 HR 82 11/21 BP 121/75 HR 95 11/22 BP 126/97 HR 91 11/23 BP 133/91 HR 82 11/24 BP 120/84 HR 90 11/25 BP 120/96 HR 80 11/26 BP 112/86 HR 96 11/27 BP 115/89 HR 85 11/28 BP 113/87 HR 100 11/29 BP 108/85 HR 110 11/30 BP 109/83 HR 87 12/1 BP 115/91 HR 86  Patient st he takes his BP and HR every night at 2000 after getting home and resting. He seems a little concerned because his HR is higher now than it used to be.  To Dr. Mayford Knifeurner for review.

## 2014-08-05 NOTE — Telephone Encounter (Signed)
New message  ° ° °Patient calling back to speak with nurse  °

## 2014-08-06 NOTE — Telephone Encounter (Signed)
Have any of his meds changed since I saw him last and is he using table salt

## 2014-08-07 ENCOUNTER — Other Ambulatory Visit: Payer: Self-pay | Admitting: *Deleted

## 2014-08-07 ENCOUNTER — Other Ambulatory Visit: Payer: Self-pay

## 2014-08-07 DIAGNOSIS — I10 Essential (primary) hypertension: Secondary | ICD-10-CM

## 2014-08-07 DIAGNOSIS — I25119 Atherosclerotic heart disease of native coronary artery with unspecified angina pectoris: Secondary | ICD-10-CM

## 2014-08-07 MED ORDER — CARVEDILOL 6.25 MG PO TABS: 6.2500 mg | ORAL_TABLET | Freq: Two times a day (BID) | ORAL | Status: DC

## 2014-08-07 NOTE — Telephone Encounter (Signed)
Please get 24 hour Holter to assess average HR.  Also please have patient check his BP twice daily for a week and call with results

## 2014-08-07 NOTE — Telephone Encounter (Signed)
Patient st he does not use table salt and has not changed any medications since his last visit with Dr. Mayford Knifeurner (other than increasing the Coreg per Dr. Norris Crossurner's instructions).

## 2014-08-07 NOTE — Telephone Encounter (Signed)
Patient informed. Will order 24 hr holter and patient will monitor BP twice daily for a week and call us back.

## 2014-08-11 ENCOUNTER — Other Ambulatory Visit: Payer: Self-pay | Admitting: *Deleted

## 2014-08-11 MED ORDER — ATORVASTATIN CALCIUM 80 MG PO TABS: 80.0000 mg | ORAL_TABLET | Freq: Every day | ORAL | Status: DC

## 2014-08-12 ENCOUNTER — Encounter: Payer: Self-pay | Admitting: *Deleted

## 2014-08-12 ENCOUNTER — Telehealth: Payer: Self-pay

## 2014-08-12 ENCOUNTER — Encounter (INDEPENDENT_AMBULATORY_CARE_PROVIDER_SITE_OTHER): Payer: 59

## 2014-08-12 DIAGNOSIS — R000 Tachycardia, unspecified: Secondary | ICD-10-CM

## 2014-08-12 DIAGNOSIS — I10 Essential (primary) hypertension: Secondary | ICD-10-CM

## 2014-08-12 DIAGNOSIS — I25119 Atherosclerotic heart disease of native coronary artery with unspecified angina pectoris: Secondary | ICD-10-CM

## 2014-08-12 NOTE — Progress Notes (Signed)
Patient ID: Henry Howe, male   DOB: 1971/03/06, 43 y.o.   MRN: [id number] [facility] 24 hour holter monitor applied to patient.

## 2014-08-12 NOTE — Telephone Encounter (Signed)
Shelly in monitoring called to get correct order for holter.  Reordered with diagnosis so patient can get his monitor today.

## 2014-08-12 NOTE — Telephone Encounter (Signed)
Mr. Henry Howe in office to get Holter monitor. He brought a log of his BP readings. 08/08/14 AM: 109/88 HR 84   PM: 118/94 HR 80 08/09/14 AM: 115/92 HR 90   PM: 110/90 HR 97 08/10/14 AM: 124/94 HR 79   PM: 120/84 HR 83 08/11/14 AM: 116/94 HR 74   PM: 111/90 HR 94 08/12/14 AM: 109/90 HR 81  To Dr. Mayford Knifeurner for review.

## 2014-08-12 NOTE — Addendum Note (Signed)
Addended by: Gunnar FusiKEMP, Memory Heinrichs A on: 08/12/2014 09:51 AM   Modules accepted: Orders

## 2014-08-13 ENCOUNTER — Other Ambulatory Visit: Payer: Self-pay | Admitting: *Deleted

## 2014-08-13 ENCOUNTER — Encounter (HOSPITAL_COMMUNITY): Payer: Self-pay | Admitting: Cardiology

## 2014-08-13 MED ORDER — CLOPIDOGREL BISULFATE 75 MG PO TABS: 75.0000 mg | ORAL_TABLET | Freq: Every day | ORAL | Status: DC

## 2014-08-13 NOTE — Telephone Encounter (Signed)
Diastolic BP still too high.  Please add HCTZ 25mg  daily and check BP daily for a week and call with results.  Patient will need a BMET in 1 week

## 2014-08-13 NOTE — Telephone Encounter (Signed)
Left message to call back  

## 2014-08-14 NOTE — Telephone Encounter (Signed)
Received automated message that the person called is unavailable.  No opportunity to leave a message. Will try again later.

## 2014-08-17 MED ORDER — HYDROCHLOROTHIAZIDE 25 MG PO TABS: 25.0000 mg | ORAL_TABLET | Freq: Every day | ORAL | Status: DC

## 2014-08-17 NOTE — Telephone Encounter (Signed)
Instructed patient to add 25 mg HCTZ daily and check BP daily for a week. BMET scheduled for next Monday and patient st he will bring BP readings then. Patient agrees with treatment plan.

## 2014-08-18 ENCOUNTER — Telehealth: Payer: Self-pay | Admitting: Cardiology

## 2014-08-18 NOTE — Telephone Encounter (Signed)
Patient informed of monitor results and verbal understanding expressed.   

## 2014-08-18 NOTE — Telephone Encounter (Signed)
Please let patient know that heart monitor showed NSR with occasional PAC's and PVC's which are benign.  Her avg HT was 78bpm

## 2014-08-24 ENCOUNTER — Telehealth: Payer: Self-pay | Admitting: Cardiology

## 2014-08-24 ENCOUNTER — Other Ambulatory Visit (INDEPENDENT_AMBULATORY_CARE_PROVIDER_SITE_OTHER): Payer: 59 | Admitting: *Deleted

## 2014-08-24 DIAGNOSIS — I10 Essential (primary) hypertension: Secondary | ICD-10-CM

## 2014-08-24 LAB — BASIC METABOLIC PANEL
BUN: 22 mg/dL (ref 6–23)
CO2: 27 mEq/L (ref 19–32)
Calcium: 9.1 mg/dL (ref 8.4–10.5)
Chloride: 99 mEq/L (ref 96–112)
Creatinine, Ser: 1 mg/dL (ref 0.4–1.5)
GFR: 86.15 mL/min (ref >60.00)
GLUCOSE: 105 mg/dL — AB (ref 70–99)
POTASSIUM: 3.5 meq/L (ref 3.5–5.1)
Sodium: 136 mEq/L (ref 135–145)

## 2014-08-24 NOTE — Telephone Encounter (Signed)
Walk in pt form" BP readings" gave to Katy/KM

## 2014-08-25 NOTE — Telephone Encounter (Signed)
Blood pressure readings:  12/18 AM: BP 113/88 HR 81           PM: BP 121/81 HR 95 12/19 AM: BP 104/81 HR 77           PM: BP 109/81 HR 92 12/20 AM: BP 102/79 HR 93           PM: BP 113/90 HR 103  To Dr. Mayford Knifeurner for review.

## 2014-08-25 NOTE — Telephone Encounter (Signed)
-  BP well controlled -continue current meds 

## 2014-08-25 NOTE — Telephone Encounter (Signed)
Instructed patient to continue current medications. Patient agrees with treatment plan. 

## 2014-09-02 ENCOUNTER — Other Ambulatory Visit: Payer: Self-pay | Admitting: *Deleted

## 2014-09-02 MED ORDER — CARVEDILOL 6.25 MG PO TABS: 6.2500 mg | ORAL_TABLET | Freq: Two times a day (BID) | ORAL | Status: DC

## 2014-09-19 ENCOUNTER — Other Ambulatory Visit: Payer: Self-pay | Admitting: Cardiology

## 2014-09-21 ENCOUNTER — Other Ambulatory Visit: Payer: Self-pay

## 2014-09-21 ENCOUNTER — Other Ambulatory Visit: Payer: Self-pay | Admitting: *Deleted

## 2014-09-21 MED ORDER — NITROGLYCERIN 0.4 MG SL SUBL: 0.4000 mg | SUBLINGUAL_TABLET | SUBLINGUAL | Status: DC | PRN

## 2014-09-21 MED ORDER — CLOPIDOGREL BISULFATE 75 MG PO TABS: 75.0000 mg | ORAL_TABLET | Freq: Every day | ORAL | Status: DC

## 2014-09-21 MED ORDER — ATORVASTATIN CALCIUM 80 MG PO TABS: 80.0000 mg | ORAL_TABLET | Freq: Every day | ORAL | Status: DC

## 2014-10-05 ENCOUNTER — Other Ambulatory Visit: Payer: Self-pay | Admitting: Cardiology

## 2014-10-30 ENCOUNTER — Other Ambulatory Visit: Payer: Self-pay | Admitting: Cardiology

## 2014-10-30 MED ORDER — CLOPIDOGREL BISULFATE 75 MG PO TABS: 75.0000 mg | ORAL_TABLET | Freq: Every day | ORAL | Status: DC

## 2014-11-04 ENCOUNTER — Other Ambulatory Visit: Payer: Self-pay

## 2014-11-04 NOTE — Telephone Encounter (Signed)
Medication Detail      Disp Refills Start End     carvedilol (COREG) 6.25 MG tablet 60 tablet 1 10/06/2014     Sig: TAKE 1 TABLET (6.25 MG TOTAL) BY MOUTH 2 (TWO) TIMES DAILY WITH A MEAL.    Notes to Pharmacy: .Marland Kitchen.Marland Kitchen.Patient needs to contact office to schedule Appointment for future refills.Ph:[phone]. Thank you.    E-Prescribing Status: Receipt confirmed by pharmacy (10/06/2014 12:09 PM EST)     Pharmacy    [id number] [place] - [address]

## 2014-12-22 ENCOUNTER — Other Ambulatory Visit: Payer: Self-pay | Admitting: *Deleted

## 2014-12-22 MED ORDER — CLOPIDOGREL BISULFATE 75 MG PO TABS: 75.0000 mg | ORAL_TABLET | Freq: Every day | ORAL | Status: DC

## 2014-12-27 IMAGING — CR DG CHEST 2V
2 series · 2 of 2 positions shown · non-contrast
Comparison: None.

CLINICAL DATA: Left-sided chest discomfort.

EXAM:
CHEST  2 VIEW

[w chest pa]
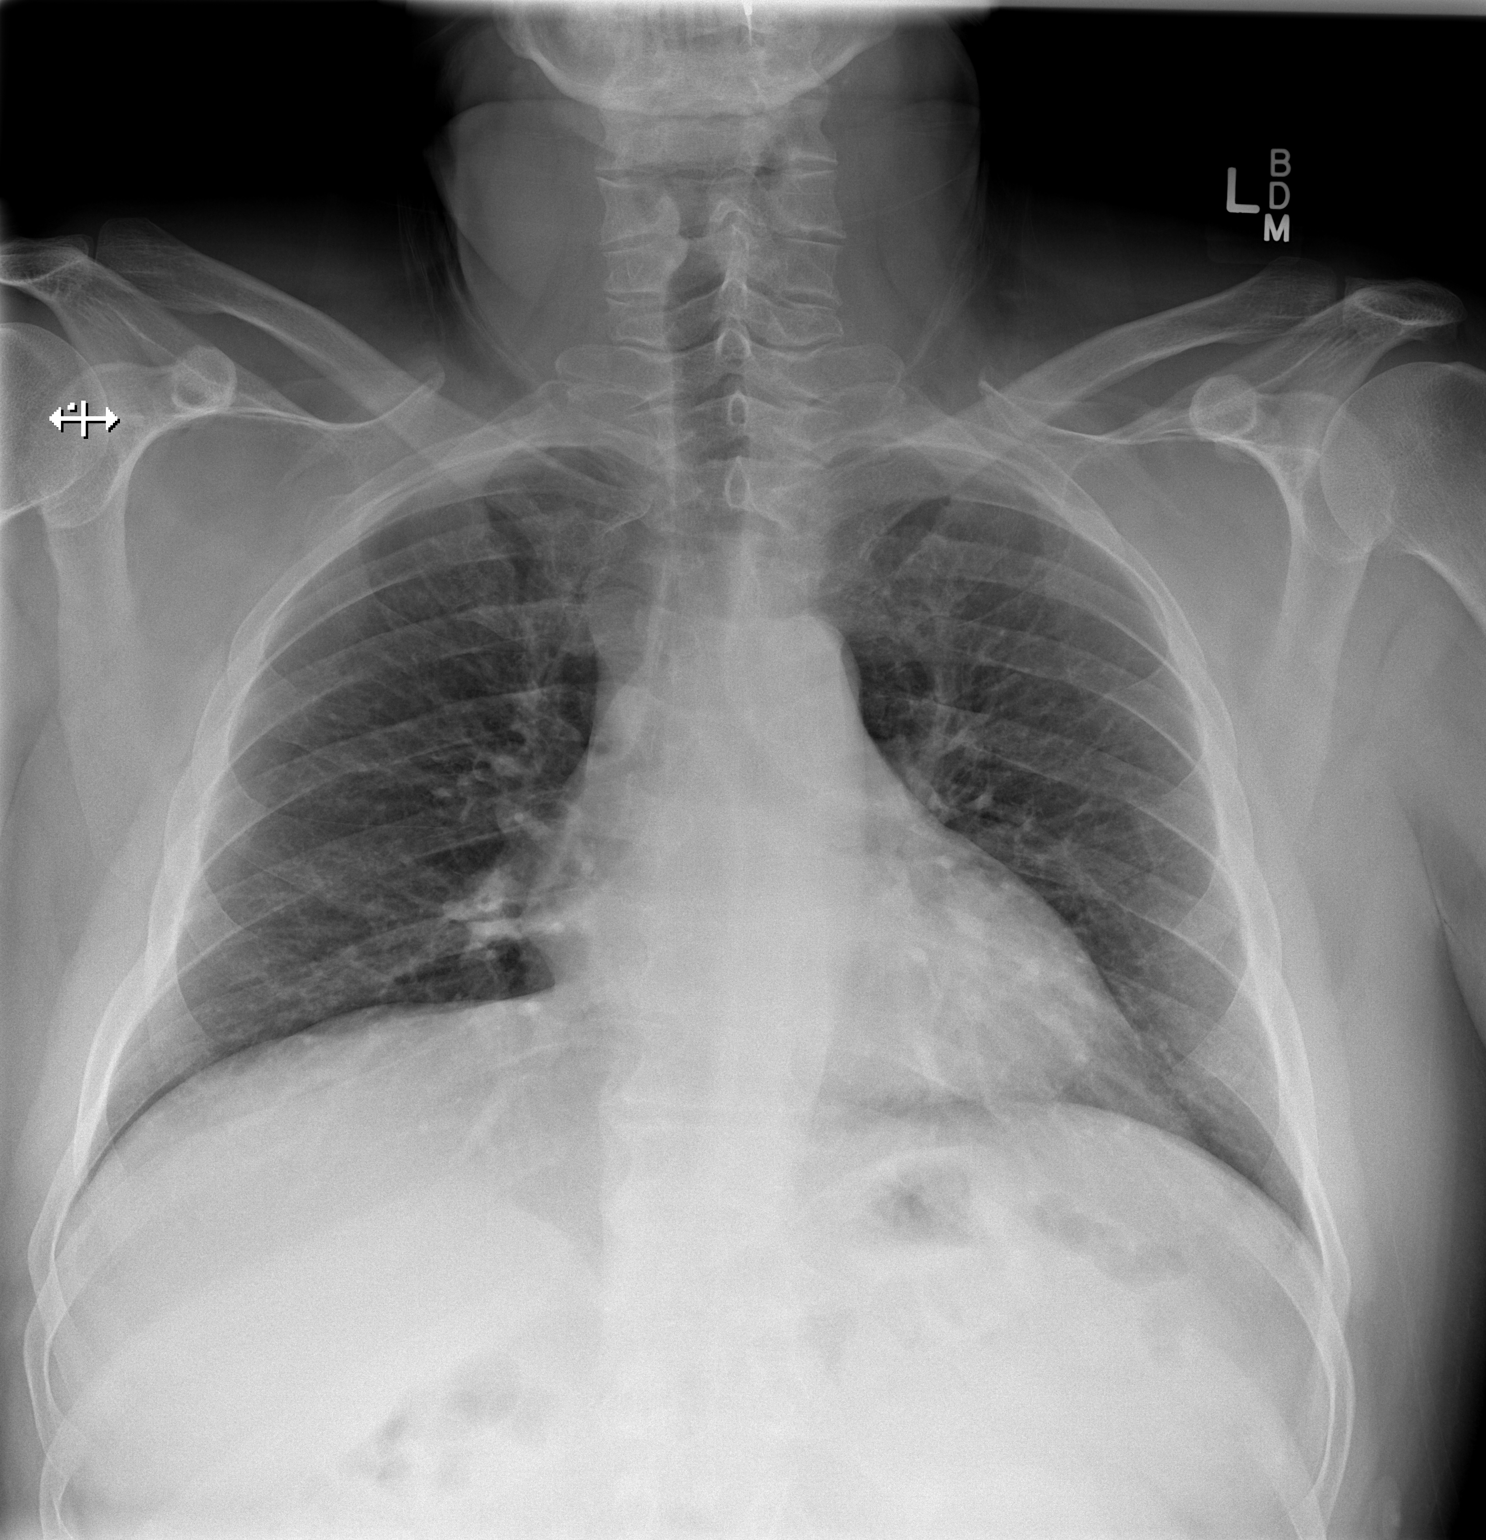

[w chest lat]
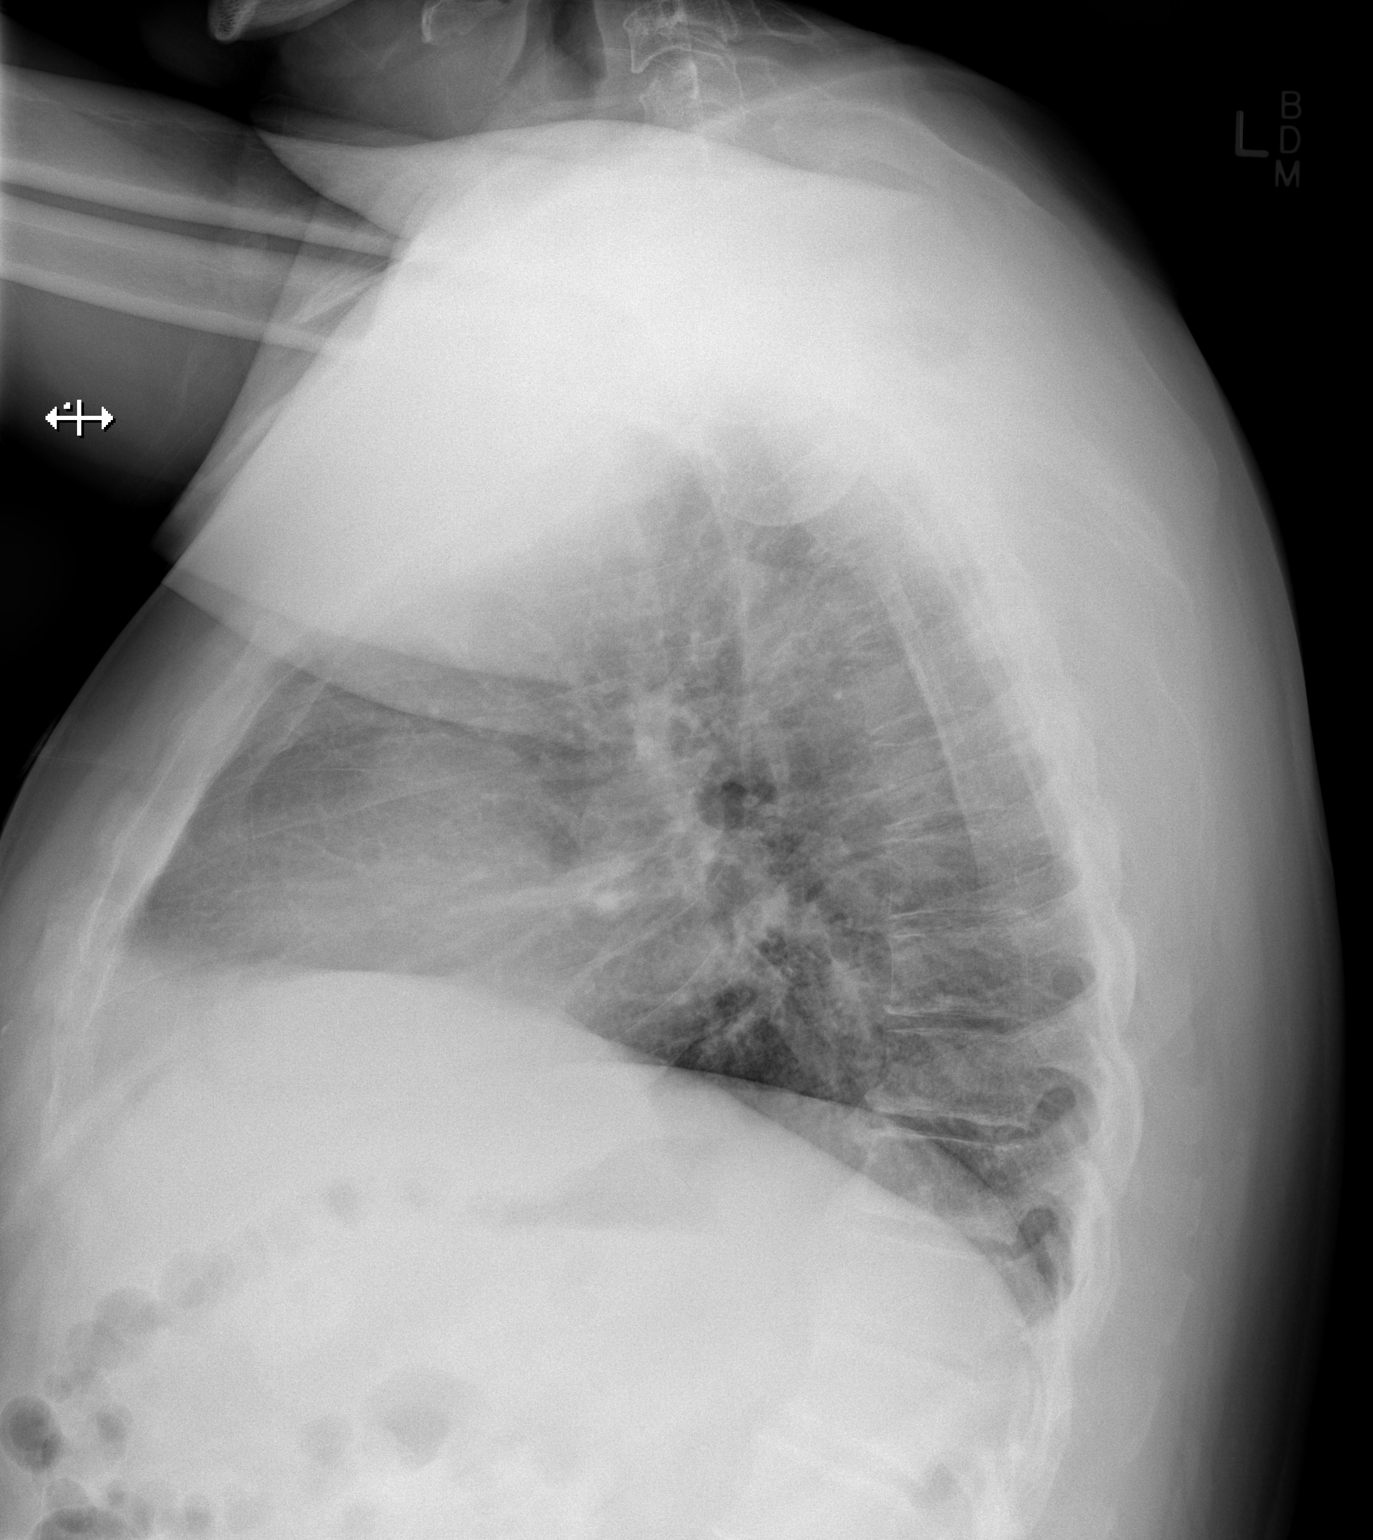

[2 of 2 positions shown; findings below may reference images not displayed]

FINDINGS: The lungs are borderline hypoinflated. There is no focal infiltrate.
The cardiac silhouette is normal in size. The pulmonary vascularity
is not engorged. The mediastinum is normal in width. There is no
pleural effusion or pneumothorax. The observed portions of the bony
thorax exhibit no acute abnormality. The observed portions of the
upper abdomen exhibit no acute abnormalities.
IMPRESSION: There is no evidence of active cardiopulmonary abnormality.
Specifically there are no findings to suggest CHF.

## 2014-12-29 IMAGING — CT CT HEAD W/O CM
1 series · 16 of 30 positions shown, 20 images · non-contrast
Comparison: None.

CLINICAL DATA: Headache.  MI.

EXAM:
CT HEAD WITHOUT CONTRAST
TECHNIQUE: Contiguous axial images were obtained from the base of the skull
through the vertex without intravenous contrast.

[Series 2: head 5.0 h30s · axial · 0.43mm/px · z∈[-105,+40]mm · 16 of 33 slices shown, 20 images]
[im 2/33  brain]
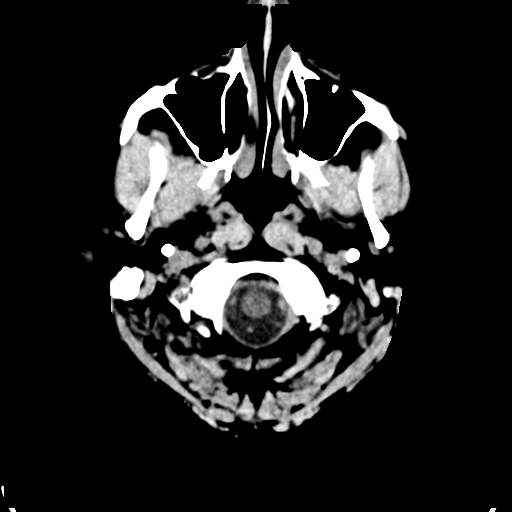
[im 2/33  bone]
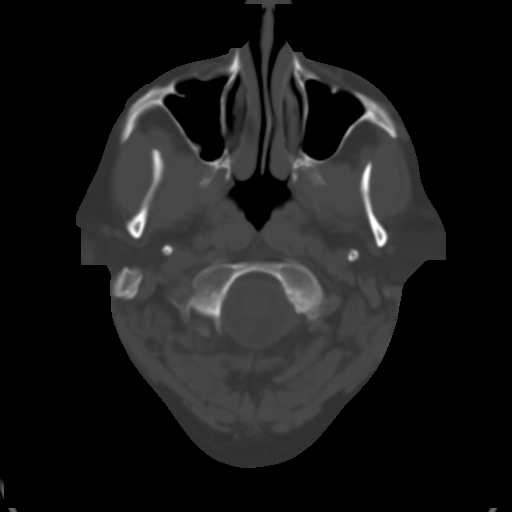
[im 4/33  brain]
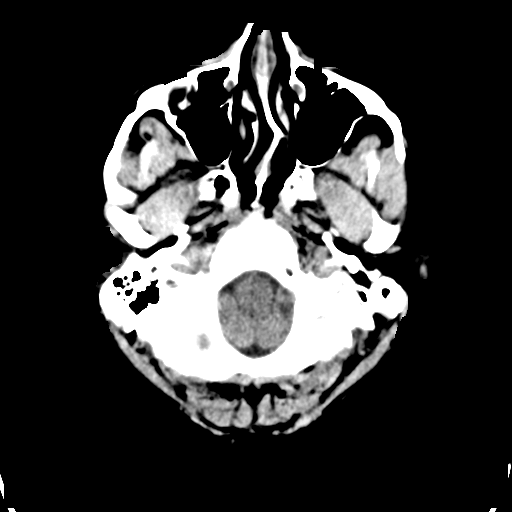
[im 6/33  brain]
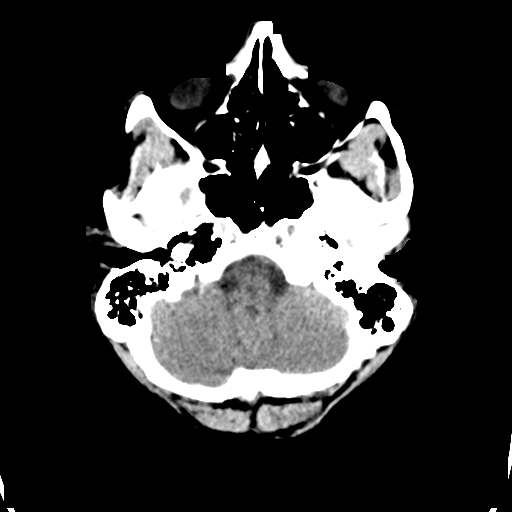
[im 8/33  brain]
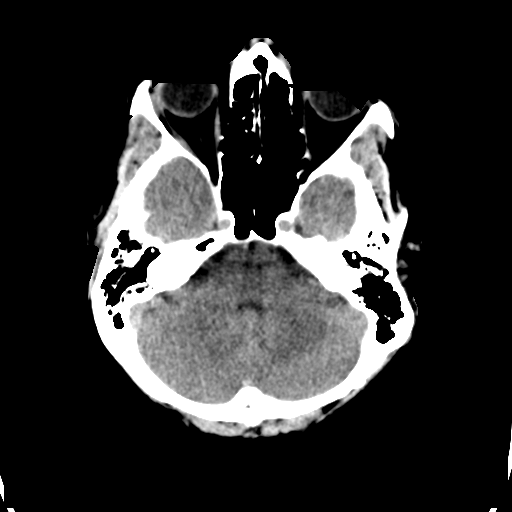
[im 9/33  brain]
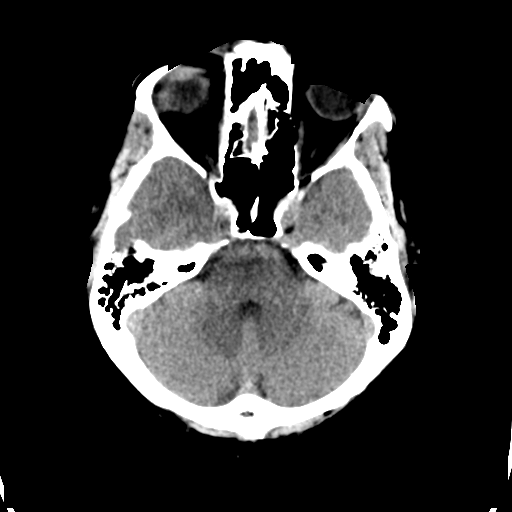
[im 9/33  bone]
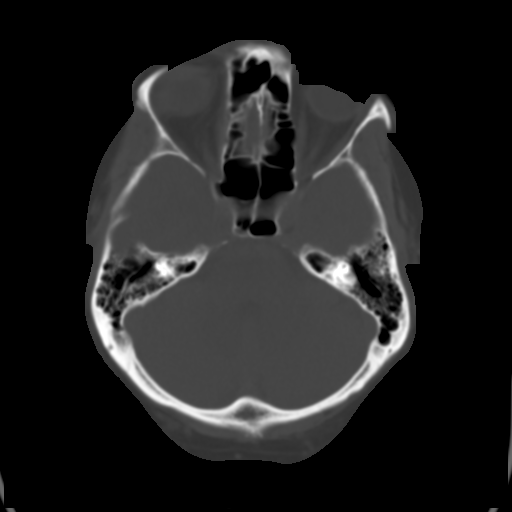
[im 12/33  brain]
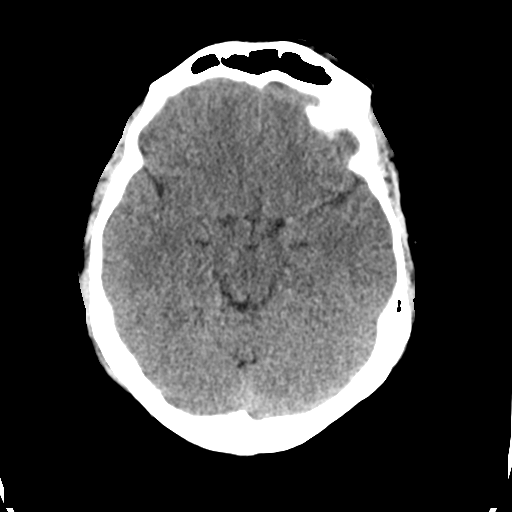
[im 14/33  brain]
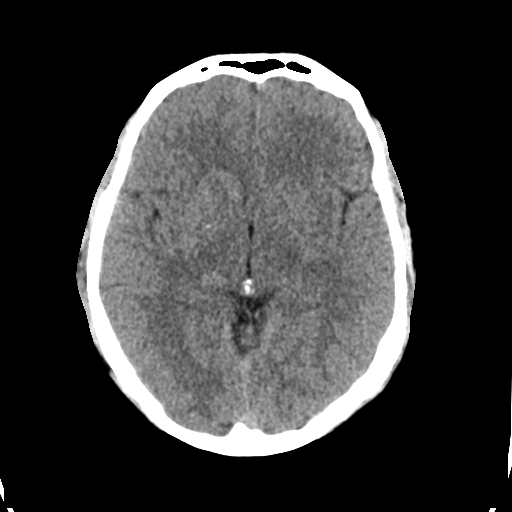
[im 16/33  brain]
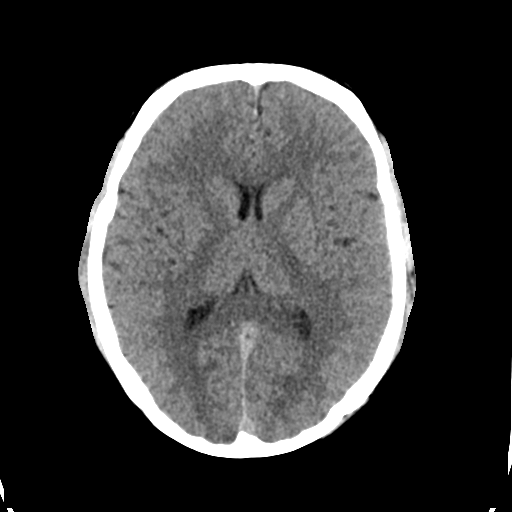
[im 17/33  brain]
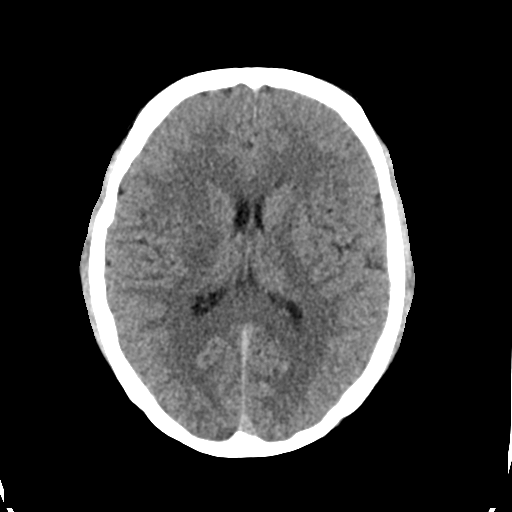
[im 17/33  bone]
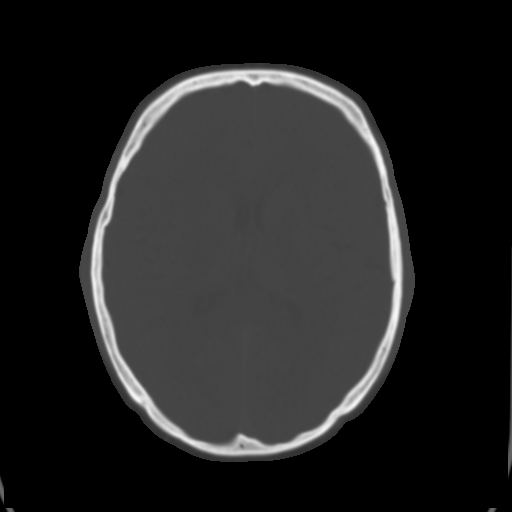
[im 19/33  brain]
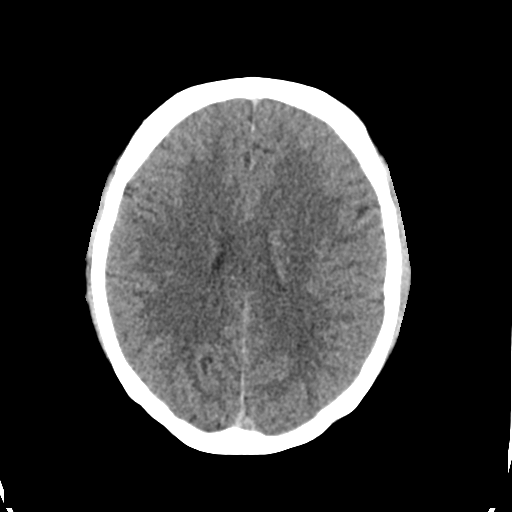
[im 21/33  brain]
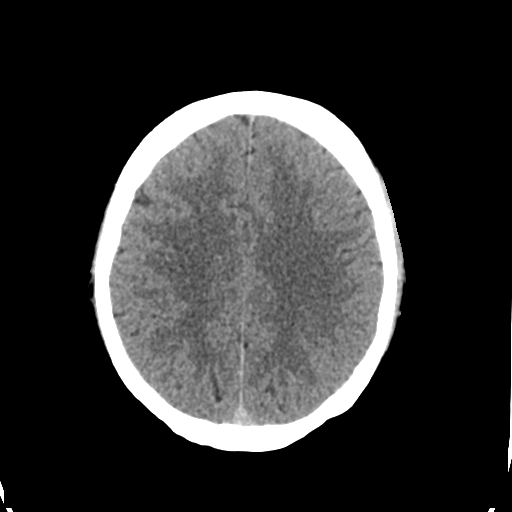
[im 24/33  brain]
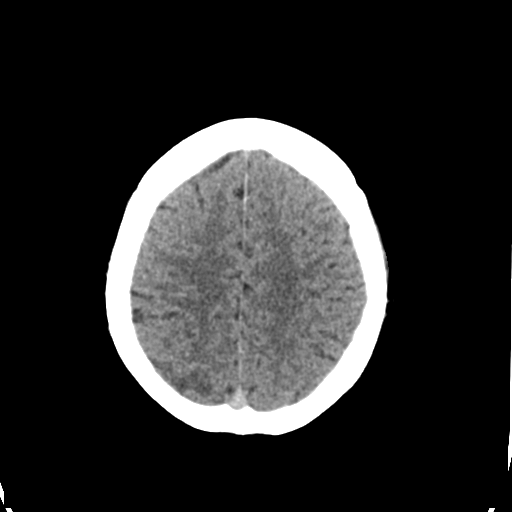
[im 25/33  brain]
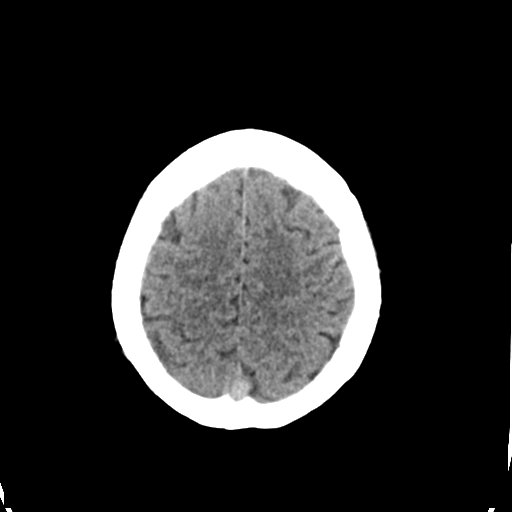
[im 25/33  bone]
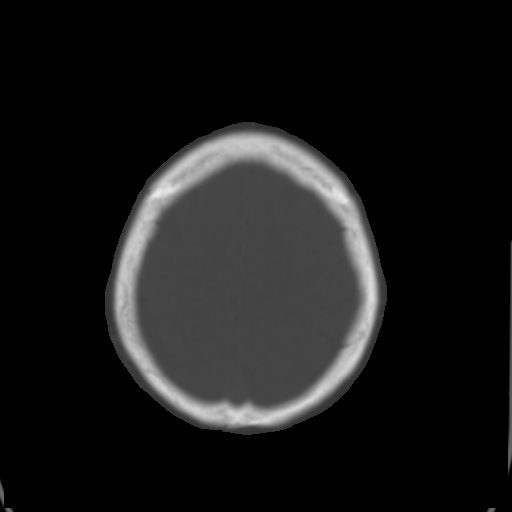
[im 27/33  brain]
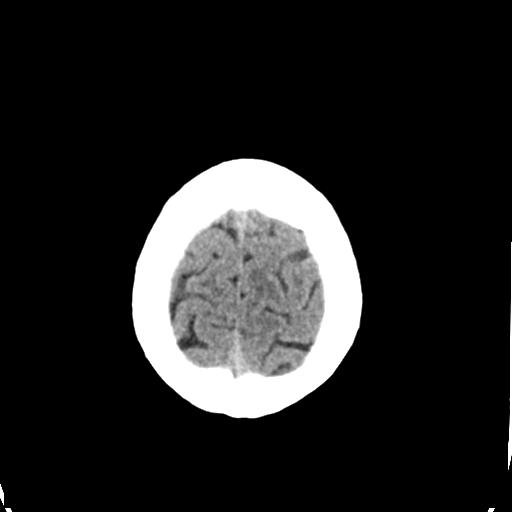
[im 29/33  brain]
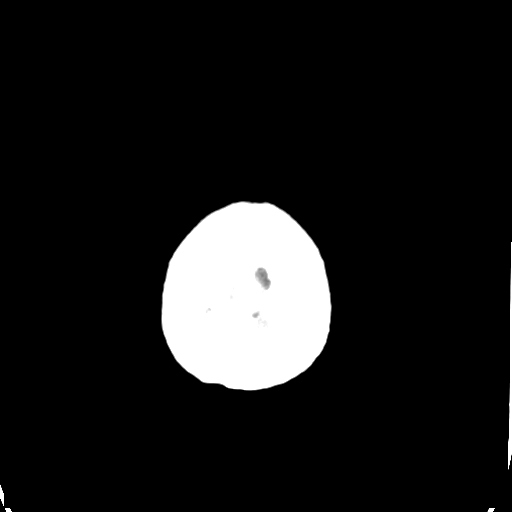
[im 31/33  brain]
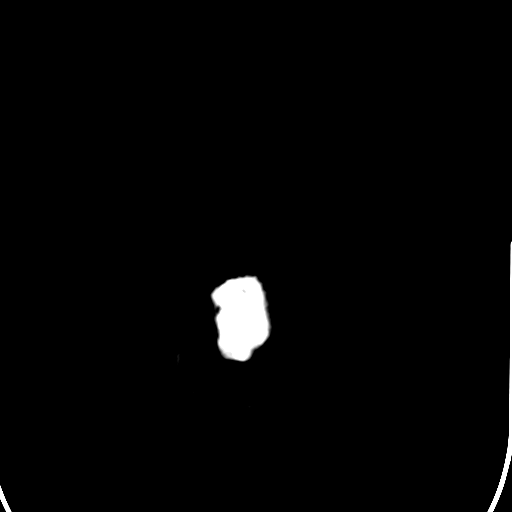

[16 of 30 positions shown; findings below may reference images not displayed]

FINDINGS: No mass. No hydrocephalus. No hemorrhage. Orbits are unremarkable.
The paranasal sinuses are clear. Mastoids clear. No acute bony
abnormality.
IMPRESSION: No acute or focal abnormality.

## 2015-01-06 NOTE — Progress Notes (Signed)
Cardiology Office Note   Date:  01/07/2015   ID:  Henry Howe, DOB 30-Aug-1971, MRN [id number]  PCP:  No primary care provider on file.    Chief Complaint  Patient presents with  . Coronary Artery Disease  . Follow-up    hypertension  . Follow-up    hyperlipidemia      History of Present Illness: Henry Howe is a 44 y.o. male with a  history of NSTEMI and underwent cath showing an occluded OM and 80-90% PL branch stenosis with normal LVF. After reivew of films with interventionalist medical management was continued. He presents today for followup. He denies any further anginal CP, SOB, LE edema, dizziness, palpitations or syncope.   Past Medical History  Diagnosis Date  . Asthma   . Peptic ulcer 2011  . Hyperlipidemia   . CAD in native artery 07/29/2013    Cath 07/29/13: Occluded OM, 80% posterior lateral branch, mild luminal irregularities of LAD, normal EF-medical management    Past Surgical History  Procedure Laterality Date  . Knee surgery      x 3  . Esophagogastroduodenoscopy      Hx ulcers  . Left heart catheterization with coronary angiogram N/A 07/29/2013    Procedure: LEFT HEART CATHETERIZATION WITH CORONARY ANGIOGRAM;  Surgeon: Donato SchultzMark Skains, Henry Howe;  [facility];  Service: Cardiovascular;  Laterality: N/A;     Current Outpatient Prescriptions  Medication Sig Dispense Refill  . albuterol (ACCUNEB) 0.63 MG/3ML nebulizer solution Take 1 ampule by nebulization every 6 (six) hours as needed for wheezing or shortness of breath.    Marland Kitchen. aspirin EC 81 MG EC tablet Take 1 tablet (81 mg total) by mouth daily.    Marland Kitchen. atorvastatin (LIPITOR) 80 MG tablet Take 1 tablet (80 mg total) by mouth daily at 6 PM. 30 tablet 2  . carvedilol (COREG) 3.125 MG tablet Take 1 tablet (3.125 mg total) by mouth 2 (two) times daily.    . clopidogrel (PLAVIX) 75 MG tablet Take 1 tablet (75 mg total) by mouth daily with breakfast. 30 tablet 0  . hydrochlorothiazide  (HYDRODIURIL) 25 MG tablet Take 1 tablet (25 mg total) by mouth daily. 30 tablet 11  . nitroGLYCERIN (NITROSTAT) 0.4 MG SL tablet Place 1 tablet (0.4 mg total) under the tongue every 5 (five) minutes x 3 doses as needed for chest pain. 25 tablet 0   No current facility-administered medications for this visit.    Allergies:   Erythromycin    Social History:  The patient  reports that he quit smoking about 21 years ago. He has never used smokeless tobacco. He reports that he drinks alcohol. He reports that he does not use illicit drugs.   Family History:  The patient's family history includes Diabetes in his father and mother; Hypertension in his mother.    ROS:  Please see the history of present illness.   Otherwise, review of systems are positive for none.   All other systems are reviewed and negative.    PHYSICAL EXAM: VS:  BP 120/90 mmHg  Pulse 57  Ht 5' 10.5" (1.791 m)  Wt 223 lb 1.9 oz (101.207 kg)  BMI 31.55 kg/m2 , BMI Body mass index is 31.55 kg/(m^2). GEN: Well nourished, well developed, in no acute distress HEENT: normal Neck: no JVD, carotid bruits, or masses Cardiac: RRR; no murmurs, rubs, or gallops,no edema  Respiratory:  clear to auscultation bilaterally, normal work of breathing GI: soft, nontender, nondistended, + BS MS:  no deformity or atrophy Skin: warm and dry, no rash Neuro:  Strength and sensation are intact Psych: euthymic mood, full affect   EKG:  EKG was ordered today and showed NSR with LVH and inferior infarct    Recent Labs: 08/24/2014: BUN 22; Creatinine 1.0; Potassium 3.5; Sodium 136    Lipid Panel    Component Value Date/Time   CHOL 194 12/08/2013 0742   TRIG 121.0 12/08/2013 0742   HDL 38.50* 12/08/2013 0742   CHOLHDL 5 12/08/2013 0742   VLDL 24.2 12/08/2013 0742   LDLCALC 131* 12/08/2013 0742      Wt Readings from Last 3 Encounters:  01/07/15 223 lb 1.9 oz (101.207 kg)  02/13/14 224 lb (101.606 kg)  09/02/13 224 lb 10.4 oz  (101.9 kg)    ASSESSMENT AND PLAN:  1. ASCAD with remote NSTEMI on medical management with occluded OM and 80-90% PL branch stenosis. He denies any anginal pain. - continue ASA/Plavix/statin 2. Dyslipidemia - continue atorvastatin - he uses it 3-4 times weekly because he cannot tolerate it any more frequently.  He just stopped the [facility] 9 trial drug - encouraged him to get into a routine walking program and follow a good low fat diet - check FLP and ALT 3 in 3 months to reassess off study drug 3. HTN - fairly well controlled.  He is feeling less energy on the BB.   - decrease carvedilol to 1/2 tablet BID and he will check his BP daily for a week and call with results and let me know if fatigue has improved.       Current medicines are reviewed at length with the patient today.  The patient does not have concerns regarding medicines.  The following changes have been made:  no change  Labs/ tests ordered today include: see above assessment and plan  Orders Placed This Encounter  Procedures  . Hepatic function panel  . Lipid Profile  . EKG 12-Lead     Disposition:   FU with me in 6 months   Signed, Quintella ReichertURNER,Henry Grisby Howe, Henry Howe  01/07/2015 1:23 PM    [facility] [address], [place], [place]  [address] Phone: [phone]; Fax: [phone]

## 2015-01-07 ENCOUNTER — Encounter: Payer: Self-pay | Admitting: Cardiology

## 2015-01-07 ENCOUNTER — Ambulatory Visit (INDEPENDENT_AMBULATORY_CARE_PROVIDER_SITE_OTHER): Payer: 59 | Admitting: Cardiology

## 2015-01-07 VITALS — BP 120/90 | HR 57 | Ht 70.5 in | Wt 223.1 lb

## 2015-01-07 DIAGNOSIS — I10 Essential (primary) hypertension: Secondary | ICD-10-CM | POA: Diagnosis not present

## 2015-01-07 DIAGNOSIS — E785 Hyperlipidemia, unspecified: Secondary | ICD-10-CM | POA: Diagnosis not present

## 2015-01-07 DIAGNOSIS — I252 Old myocardial infarction: Secondary | ICD-10-CM

## 2015-01-07 DIAGNOSIS — I2510 Atherosclerotic heart disease of native coronary artery without angina pectoris: Secondary | ICD-10-CM

## 2015-01-07 MED ORDER — HYDROCHLOROTHIAZIDE 25 MG PO TABS: 25.0000 mg | ORAL_TABLET | Freq: Every day | ORAL | Status: DC

## 2015-01-07 MED ORDER — CARVEDILOL 3.125 MG PO TABS: 3.1250 mg | ORAL_TABLET | Freq: Two times a day (BID) | ORAL | Status: DC

## 2015-01-07 NOTE — Patient Instructions (Signed)
Medication Instructions:  Your physician has recommended you make the following change in your medication:  1) DECREASE COREG to 6.25 mg 1/2 tablet twice daily  Labwork: Your physician recommends that you return for FASTING lab work in: 3 months    Testing/Procedures: None  Follow-Up: Your physician wants you to follow-up in: 6 months with Dr. Mayford Knifeurner. You will receive a reminder letter in the mail two months in advance. If you don't receive a letter, please call our office to schedule the follow-up appointment.   Any Other Special Instructions Will Be Listed Below (If Applicable). Please check your blood pressure daily for one week and call with results.

## 2015-02-09 ENCOUNTER — Other Ambulatory Visit: Payer: Self-pay

## 2015-02-09 ENCOUNTER — Telehealth: Payer: Self-pay | Admitting: Cardiology

## 2015-02-09 MED ORDER — HYDROCHLOROTHIAZIDE 25 MG PO TABS: 25.0000 mg | ORAL_TABLET | Freq: Every day | ORAL | Status: DC

## 2015-02-09 MED ORDER — CARVEDILOL 3.125 MG PO TABS: 3.1250 mg | ORAL_TABLET | Freq: Two times a day (BID) | ORAL | Status: DC

## 2015-02-09 MED ORDER — ATORVASTATIN CALCIUM 80 MG PO TABS: 80.0000 mg | ORAL_TABLET | Freq: Every day | ORAL | Status: DC

## 2015-02-09 MED ORDER — CLOPIDOGREL BISULFATE 75 MG PO TABS: 75.0000 mg | ORAL_TABLET | Freq: Every day | ORAL | Status: DC

## 2015-02-09 NOTE — Telephone Encounter (Signed)
Per note 5.5.16  

## 2015-02-09 NOTE — Telephone Encounter (Signed)
Pt needs all cardiac meds refilled @ [facility] and pt wants this changed to his new pharmacy

## 2015-03-01 ENCOUNTER — Encounter (HOSPITAL_COMMUNITY): Payer: 59

## 2015-04-09 ENCOUNTER — Other Ambulatory Visit (INDEPENDENT_AMBULATORY_CARE_PROVIDER_SITE_OTHER): Payer: Self-pay | Admitting: *Deleted

## 2015-04-09 DIAGNOSIS — E785 Hyperlipidemia, unspecified: Secondary | ICD-10-CM

## 2015-04-09 LAB — LIPID PANEL
CHOL/HDL RATIO: 5
Cholesterol: 237 mg/dL — ABNORMAL HIGH (ref 0–200)
HDL: 44.1 mg/dL (ref >39.00)
LDL Cholesterol: 155 mg/dL — ABNORMAL HIGH (ref 0–99)
NonHDL: 192.89
Triglycerides: 187 mg/dL — ABNORMAL HIGH (ref 0.0–149.0)
VLDL: 37.4 mg/dL (ref 0.0–40.0)

## 2015-04-09 LAB — HEPATIC FUNCTION PANEL
ALT: 46 U/L (ref 0–53)
AST: 30 U/L (ref 0–37)
Albumin: 4 g/dL (ref 3.5–5.2)
Alkaline Phosphatase: 81 U/L (ref 39–117)
BILIRUBIN TOTAL: 0.3 mg/dL (ref 0.2–1.2)
Bilirubin, Direct: 0.1 mg/dL (ref 0.0–0.3)
Total Protein: 7.2 g/dL (ref 6.0–8.3)

## 2015-06-02 ENCOUNTER — Telehealth: Payer: Self-pay | Admitting: Cardiology

## 2015-06-02 NOTE — Telephone Encounter (Signed)
NEwMEssage  Pt calling to have all Rx filled-pt stated never went to pick up meds last time ("around 2 months") and needs meds refilled. Please call back and discuss.

## 2015-06-02 NOTE — Telephone Encounter (Signed)
Attempted to call.  No voicemail set up.   

## 2015-06-07 NOTE — Telephone Encounter (Signed)
Spoke with patient this am. He did not realize he still had refills on his meds. Has an appointment with Dr. Mayford Knife in November.

## 2015-07-23 ENCOUNTER — Ambulatory Visit (INDEPENDENT_AMBULATORY_CARE_PROVIDER_SITE_OTHER): Payer: BLUE CROSS/BLUE SHIELD | Admitting: Cardiology

## 2015-07-23 ENCOUNTER — Encounter: Payer: Self-pay | Admitting: Cardiology

## 2015-07-23 VITALS — BP 110/90 | HR 72 | Ht 70.0 in | Wt 234.5 lb

## 2015-07-23 DIAGNOSIS — E785 Hyperlipidemia, unspecified: Secondary | ICD-10-CM | POA: Diagnosis not present

## 2015-07-23 DIAGNOSIS — I10 Essential (primary) hypertension: Secondary | ICD-10-CM

## 2015-07-23 DIAGNOSIS — I2510 Atherosclerotic heart disease of native coronary artery without angina pectoris: Secondary | ICD-10-CM

## 2015-07-23 DIAGNOSIS — I252 Old myocardial infarction: Secondary | ICD-10-CM

## 2015-07-23 LAB — BASIC METABOLIC PANEL
BUN: 19 mg/dL (ref 7–25)
CHLORIDE: 102 mmol/L (ref 98–110)
CO2: 26 mmol/L (ref 20–31)
Calcium: 9 mg/dL (ref 8.6–10.3)
Creat: 1.12 mg/dL (ref 0.60–1.35)
Glucose, Bld: 88 mg/dL (ref 65–99)
POTASSIUM: 3.4 mmol/L — AB (ref 3.5–5.3)
SODIUM: 140 mmol/L (ref 135–146)

## 2015-07-23 LAB — HEPATIC FUNCTION PANEL
ALBUMIN: 4 g/dL (ref 3.6–5.1)
ALT: 45 U/L (ref 9–46)
AST: 29 U/L (ref 10–40)
Alkaline Phosphatase: 84 U/L (ref 40–115)
BILIRUBIN TOTAL: 0.7 mg/dL (ref 0.2–1.2)
Bilirubin, Direct: 0.2 mg/dL (ref <=0.2)
Indirect Bilirubin: 0.5 mg/dL (ref 0.2–1.2)
Total Protein: 7.4 g/dL (ref 6.1–8.1)

## 2015-07-23 LAB — LIPID PANEL
CHOL/HDL RATIO: 5.1 ratio — AB (ref <=5.0)
Cholesterol: 215 mg/dL — ABNORMAL HIGH (ref 125–200)
HDL: 42 mg/dL (ref >=40)
LDL Cholesterol: 147 mg/dL — ABNORMAL HIGH (ref <130)
Triglycerides: 128 mg/dL (ref <150)
VLDL: 26 mg/dL (ref <30)

## 2015-07-23 NOTE — Patient Instructions (Signed)
Medication Instructions:  Your physician recommends that you continue on your current medications as directed. Please refer to the Current Medication list given to you today.   Labwork: TODAY: BMET, LFTs, Lipids  Testing/Procedures: None  Follow-Up: Your physician wants you to follow-up in: 6 months with Dr. Turner. You will receive a reminder letter in the mail two months in advance. If you don't receive a letter, please call our office to schedule the follow-up appointment.   Any Other Special Instructions Will Be Listed Below (If Applicable).     If you need a refill on your cardiac medications before your next appointment, please call your pharmacy.   

## 2015-07-23 NOTE — Progress Notes (Addendum)
Cardiology Office Note   Date:  07/23/2015   ID:  Henry Howe, DOB Jun 26, 1971, MRN [id number]  PCP:  No PCP Per Patient    Chief Complaint  Patient presents with  . Coronary Artery Disease  . Hyperlipidemia      History of Present Illness: Henry Howe is a 44 y.o. male with a history of NSTEMI and underwent cath showing an occluded OM and 80-90% PL branch stenosis with normal LVF. After reivew of films with interventionalist medical management was continued. He presents today for followup. He denies any further anginal CP, SOB, LE edema, dizziness, palpitations or syncope.     Past Medical History  Diagnosis Date  . Asthma   . Peptic ulcer 2011  . Hyperlipidemia   . CAD in native artery 07/29/2013    Cath 07/29/13: Occluded OM, 80% posterior lateral branch, mild luminal irregularities of LAD, normal EF-medical management    Past Surgical History  Procedure Laterality Date  . Knee surgery      x 3  . Esophagogastroduodenoscopy      Hx ulcers  . Left heart catheterization with coronary angiogram N/A 07/29/2013    Procedure: LEFT HEART CATHETERIZATION WITH CORONARY ANGIOGRAM;  Surgeon: Donato Schultz, MD;  Location[facility] CATH LAB;  Service: Cardiovascular;  Laterality: N/A;     Current Outpatient Prescriptions  Medication Sig Dispense Refill  . albuterol (ACCUNEB) 0.63 MG/3ML nebulizer solution Take 1 ampule by nebulization every 6 (six) hours as needed for wheezing or shortness of breath.    Marland Kitchen aspirin EC 81 MG EC tablet Take 1 tablet (81 mg total) by mouth daily.    Marland Kitchen atorvastatin (LIPITOR) 80 MG tablet Take 1 tablet (80 mg total) by mouth daily at 6 PM. 30 tablet 6  . carvedilol (COREG) 3.125 MG tablet Take 1 tablet (3.125 mg total) by mouth 2 (two) times daily. 60 tablet 6  . clopidogrel (PLAVIX) 75 MG tablet Take 1 tablet (75 mg total) by mouth daily with breakfast. 30 tablet 6  . hydrochlorothiazide (HYDRODIURIL) 25 MG tablet Take 1  tablet (25 mg total) by mouth daily. 30 tablet 6  . nitroGLYCERIN (NITROSTAT) 0.4 MG SL tablet Place 1 tablet (0.4 mg total) under the tongue every 5 (five) minutes x 3 doses as needed for chest pain. 25 tablet 0  . penicillin v potassium (VEETID) 500 MG tablet Take 1 tablet by mouth 4 (four) times daily. Take 1 tab qid  0   No current facility-administered medications for this visit.    Allergies:   Hydrocodone and Erythromycin    Social History:  The patient  reports that he quit smoking about 21 years ago. He has never used smokeless tobacco. He reports that he drinks alcohol. He reports that he does not use illicit drugs.   Family History:  The patient's family history includes Diabetes in his father and mother; Hypertension in his mother.    ROS:  Please see the history of present illness.   Otherwise, review of systems are positive for none.   All other systems are reviewed and negative.    PHYSICAL EXAM: VS:  BP 110/90 mmHg  Pulse 72  Ht  (1.778 m)  Wt 106.369 kg (234 lb 8 oz)  BMI 33.65 kg/m2 , BMI Body mass index is 33.65 kg/(m^2). GEN: Well nourished, well developed, in no acute distress HEENT: normal Neck: no JVD, carotid  bruits, or masses Cardiac: RRR; no murmurs, rubs, or gallops,no edema  Respiratory:  clear to auscultation bilaterally, normal work of breathing GI: soft, nontender, nondistended, + BS MS: no deformity or atrophy Skin: warm and dry, no rash Neuro:  Strength and sensation are intact Psych: euthymic mood, full affect   EKG:  EKG is not ordered today.    Recent Labs: 08/24/2014: BUN 22; Creatinine, Ser 1.0; Potassium 3.5; Sodium 136 04/09/2015: ALT 46    Lipid Panel    Component Value Date/Time   CHOL 237* 04/09/2015 0725   TRIG 187.0* 04/09/2015 0725   HDL 44.10 04/09/2015 0725   CHOLHDL 5 04/09/2015 0725   VLDL 37.4 04/09/2015 0725   LDLCALC 155* 04/09/2015 0725      Wt Readings from Last 3 Encounters:  07/23/15 106.369 kg (234  lb 8 oz)  01/07/15 101.207 kg (223 lb 1.9 oz)  02/13/14 101.606 kg (224 lb)    ASSESSMENT AND PLAN:  1. ASCAD with remote NSTEMI on medical management with occluded OM and 80-90% PL branch stenosis. He denies any anginal pain. - continue ASA/Plavix/statin/BB 2. Dyslipidemia - continue atorvastatin - he can only tolerate this a few times weekly - He is walking 30 minutes and 2 miles daily. - check FLP and ALT  3. HTN - fairly well controlled on BB/HCTZ   Current medicines are reviewed at length with the patient today.  The patient does not have concerns regarding medicines.  The following changes have been made:  no change  Labs/ tests ordered today: See above Assessment and Plan  Orders Placed This Encounter  Procedures  . Basic metabolic panel  . Hepatic function panel  . Lipid panel     Disposition:   FU with me in 6 months  Signed, Quintella ReichertURNER,TRACI R, MD  07/23/2015 10:53 AM    [facility] [address], [place], [place]  [address] Phone: [phone]; Fax: [phone]

## 2015-07-26 ENCOUNTER — Telehealth: Payer: Self-pay

## 2015-07-26 DIAGNOSIS — I10 Essential (primary) hypertension: Secondary | ICD-10-CM

## 2015-07-26 MED ORDER — POTASSIUM CHLORIDE CRYS ER 20 MEQ PO TBCR: 20.0000 meq | EXTENDED_RELEASE_TABLET | Freq: Every day | ORAL | Status: AC

## 2015-07-26 NOTE — Telephone Encounter (Signed)
Informed patient of results and verbal understanding expressed.  Instructed patient to START KDUR 20 meq daily. BMET scheduled for next Wednesday. Patient to wait to start Zetia when it does generic at the end of the year. He understands he will be called to order Rx and set up fasting lab work at that time.

## 2015-07-26 NOTE — Telephone Encounter (Signed)
-----   Message from Quintella Reichertraci R Turner, MD sent at 07/24/2015  4:11 PM EST ----- Add Kdur 20meq daily and Zetia 10mg  daily and recheck BMET in 1 week and  FLP and ALT in 6 weeks

## 2015-08-04 ENCOUNTER — Other Ambulatory Visit (INDEPENDENT_AMBULATORY_CARE_PROVIDER_SITE_OTHER): Payer: BLUE CROSS/BLUE SHIELD | Admitting: *Deleted

## 2015-08-04 DIAGNOSIS — I10 Essential (primary) hypertension: Secondary | ICD-10-CM

## 2015-08-04 LAB — BASIC METABOLIC PANEL
BUN: 14 mg/dL (ref 7–25)
CO2: 26 mmol/L (ref 20–31)
Calcium: 9 mg/dL (ref 8.6–10.3)
Chloride: 100 mmol/L (ref 98–110)
Creat: 0.97 mg/dL (ref 0.60–1.35)
GLUCOSE: 119 mg/dL — AB (ref 65–99)
POTASSIUM: 3.4 mmol/L — AB (ref 3.5–5.3)
SODIUM: 138 mmol/L (ref 135–146)

## 2015-08-05 ENCOUNTER — Telehealth: Payer: Self-pay

## 2015-08-05 ENCOUNTER — Other Ambulatory Visit: Payer: Self-pay

## 2015-08-05 DIAGNOSIS — I10 Essential (primary) hypertension: Secondary | ICD-10-CM

## 2015-08-05 MED ORDER — HYDROCHLOROTHIAZIDE 25 MG PO TABS: 25.0000 mg | ORAL_TABLET | Freq: Every day | ORAL | Status: DC

## 2015-08-05 MED ORDER — ATORVASTATIN CALCIUM 80 MG PO TABS: 80.0000 mg | ORAL_TABLET | Freq: Every day | ORAL | Status: DC

## 2015-08-05 MED ORDER — CARVEDILOL 3.125 MG PO TABS: 3.1250 mg | ORAL_TABLET | Freq: Two times a day (BID) | ORAL | Status: DC

## 2015-08-05 MED ORDER — CLOPIDOGREL BISULFATE 75 MG PO TABS: 75.0000 mg | ORAL_TABLET | Freq: Every day | ORAL | Status: DC

## 2015-08-05 NOTE — Telephone Encounter (Signed)
Patient is currently taking 20 meq daily.  Instructed patient to take an extra dose (20 meq) today and an extra dose (20 meq) tomorrow. BMET scheduled for 12/8. Patient agrees with treatment plan.

## 2015-08-05 NOTE — Telephone Encounter (Signed)
-----   Message from Quintella Reichertraci R Turner, MD sent at 08/04/2015  4:02 PM EST ----- Potassium low - add Kdur 20 meq - take 2 tablets today, then starting tomorrow, take 1 tablet daily and check BMET in 1 week

## 2015-08-10 ENCOUNTER — Telehealth: Payer: Self-pay | Admitting: Cardiology

## 2015-08-10 NOTE — Telephone Encounter (Signed)
error 

## 2015-08-12 ENCOUNTER — Other Ambulatory Visit (INDEPENDENT_AMBULATORY_CARE_PROVIDER_SITE_OTHER): Payer: BLUE CROSS/BLUE SHIELD

## 2015-08-12 DIAGNOSIS — I10 Essential (primary) hypertension: Secondary | ICD-10-CM

## 2015-08-12 LAB — BASIC METABOLIC PANEL
BUN: 17 mg/dL (ref 7–25)
CALCIUM: 9.4 mg/dL (ref 8.6–10.3)
CO2: 29 mmol/L (ref 20–31)
CREATININE: 0.99 mg/dL (ref 0.60–1.35)
Chloride: 99 mmol/L (ref 98–110)
GLUCOSE: 116 mg/dL — AB (ref 65–99)
Potassium: 3.6 mmol/L (ref 3.5–5.3)
Sodium: 138 mmol/L (ref 135–146)

## 2015-08-23 ENCOUNTER — Telehealth: Payer: Self-pay

## 2015-08-23 DIAGNOSIS — E785 Hyperlipidemia, unspecified: Secondary | ICD-10-CM

## 2015-08-23 MED ORDER — EZETIMIBE 10 MG PO TABS: 10.0000 mg | ORAL_TABLET | Freq: Every day | ORAL | Status: DC

## 2015-08-23 NOTE — Telephone Encounter (Signed)
-----   Message from Traci R Turner, MD sent at 07/24/2015  4:11 PM EST ----- Add Kdur 20meq daily and Zetia 10mg daily and recheck BMET in 1 week and  FLP and ALT in 6 weeks 

## 2015-08-23 NOTE — Telephone Encounter (Signed)
Zetia 10 mg daily called in to pharmacy of choice. FLP and ALT scheduled for March 1, as patient st he will not get Rx for a short time.

## 2015-11-03 ENCOUNTER — Other Ambulatory Visit: Payer: BLUE CROSS/BLUE SHIELD

## 2015-12-17 ENCOUNTER — Telehealth: Payer: Self-pay | Admitting: Cardiology

## 2015-12-17 ENCOUNTER — Other Ambulatory Visit: Payer: Self-pay

## 2015-12-17 MED ORDER — ATORVASTATIN CALCIUM 80 MG PO TABS: 80.0000 mg | ORAL_TABLET | Freq: Every day | ORAL | Status: DC

## 2015-12-17 MED ORDER — CLOPIDOGREL BISULFATE 75 MG PO TABS: 75.0000 mg | ORAL_TABLET | Freq: Every day | ORAL | Status: DC

## 2015-12-17 MED ORDER — HYDROCHLOROTHIAZIDE 25 MG PO TABS: 25.0000 mg | ORAL_TABLET | Freq: Every day | ORAL | Status: DC

## 2015-12-17 NOTE — Telephone Encounter (Signed)
New message        *STAT* If patient is at the pharmacy, call can be transferred to refill team.   1. Which medications need to be refilled? (please list name of each medication and dose if known) generic plavix 75mg , atorvastatin 80mg , HCTZ 25mg   2. Which pharmacy/location (including street and city if local pharmacy) is medication to be sent to? [email or web address] 3. Do they need a 30 day or 90 day supply? 30 day supply Pt states he has 1 pill of each left

## 2016-02-01 ENCOUNTER — Encounter: Payer: Self-pay | Admitting: Cardiology

## 2016-02-01 ENCOUNTER — Ambulatory Visit (INDEPENDENT_AMBULATORY_CARE_PROVIDER_SITE_OTHER): Payer: BLUE CROSS/BLUE SHIELD | Admitting: Cardiology

## 2016-02-01 VITALS — BP 118/90 | HR 68 | Ht 70.0 in | Wt 237.6 lb

## 2016-02-01 DIAGNOSIS — I2510 Atherosclerotic heart disease of native coronary artery without angina pectoris: Secondary | ICD-10-CM

## 2016-02-01 DIAGNOSIS — I252 Old myocardial infarction: Secondary | ICD-10-CM

## 2016-02-01 DIAGNOSIS — E785 Hyperlipidemia, unspecified: Secondary | ICD-10-CM

## 2016-02-01 DIAGNOSIS — I10 Essential (primary) hypertension: Secondary | ICD-10-CM | POA: Diagnosis not present

## 2016-02-01 NOTE — Patient Instructions (Signed)
Medication Instructions:  Your physician recommends that you continue on your current medications as directed. Please refer to the Current Medication list given to you today.   Labwork: Your physician recommends that you return for FASTING lab work in 8 weeks.   Testing/Procedures: None  Follow-Up: Your physician wants you to follow-up in: 6 months with Dr. Mayford Knifeurner. You will receive a reminder letter in the mail two months in advance. If you don't receive a letter, please call our office to schedule the follow-up appointment.   Any Other Special Instructions Will Be Listed Below (If Applicable).     If you need a refill on your cardiac medications before your next appointment, please call your pharmacy.

## 2016-02-01 NOTE — Addendum Note (Signed)
Addended by: Gunnar FusiKEMP, Haidan Nhan A on: 02/01/2016 04:10 PM   Modules accepted: Orders

## 2016-02-01 NOTE — Progress Notes (Signed)
Cardiology Office Note    Date:  02/01/2016   ID:  READ BONELLI, DOB [birth year], MRN [id number]  PCP:  Carolynn Serve, MD  Cardiologist:  Armanda Magic, MD   Chief Complaint  Patient presents with  . Coronary Artery Disease  . Hypertension  . Hyperlipidemia    History of Present Illness:  Henry Howe is a 45 y.o. male with a history of NSTEMI and underwent cath showing an occluded OM and 80-90% PL branch stenosis with normal LVF. After reivew of films with interventionalist medical management was continued. He presents today for followup. He denies any further anginal CP, SOB, LE edema, dizziness, palpitations or syncope.  He has had some back pain and stopped his statin 4 days ago to see if his back pain resolves.      Past Medical History  Diagnosis Date  . Asthma   . Peptic ulcer 2011  . Hyperlipidemia   . CAD in native artery 07/29/2013    Cath 07/29/13: Occluded OM, 80% posterior lateral branch, mild luminal irregularities of LAD, normal EF-medical management    Past Surgical History  Procedure Laterality Date  . Knee surgery      x 3  . Esophagogastroduodenoscopy      Hx ulcers  . Left heart catheterization with coronary angiogram N/A 07/29/2013    Procedure: LEFT HEART CATHETERIZATION WITH CORONARY ANGIOGRAM;  Surgeon: Donato Schultz, MD;  Location: [facility] CATH LAB;  Service: Cardiovascular;  Laterality: N/A;    Current Medications: Outpatient Prescriptions Prior to Visit  Medication Sig Dispense Refill  . albuterol (ACCUNEB) 0.63 MG/3ML nebulizer solution Take 1 ampule by nebulization every 6 (six) hours as needed for wheezing or shortness of breath.    Marland Kitchen aspirin EC 81 MG EC tablet Take 1 tablet (81 mg total) by mouth daily.    Marland Kitchen atorvastatin (LIPITOR) 80 MG tablet Take 1 tablet (80 mg total) by mouth daily at 6 PM. 30 tablet 7  . carvedilol (COREG) 3.125 MG tablet Take 1 tablet (3.125 mg total) by mouth 2 (two) times daily. 60 tablet 11  . clopidogrel  (PLAVIX) 75 MG tablet Take 1 tablet (75 mg total) by mouth daily with breakfast. 30 tablet 7  . hydrochlorothiazide (HYDRODIURIL) 25 MG tablet Take 1 tablet (25 mg total) by mouth daily. 30 tablet 7  . potassium chloride SA (K-DUR,KLOR-CON) 20 MEQ tablet Take 1 tablet (20 mEq total) by mouth daily. 30 tablet 11  . ezetimibe (ZETIA) 10 MG tablet Take 1 tablet (10 mg total) by mouth daily. (Patient not taking: Reported on 02/01/2016) 30 tablet 11  . nitroGLYCERIN (NITROSTAT) 0.4 MG SL tablet Place 1 tablet (0.4 mg total) under the tongue every 5 (five) minutes x 3 doses as needed for chest pain. (Patient not taking: Reported on 02/01/2016) 25 tablet 0  . penicillin v potassium (VEETID) 500 MG tablet Take 1 tablet by mouth 4 (four) times daily. Reported on 02/01/2016  0   No facility-administered medications prior to visit.     Allergies:   Erythromycin; Hydrocodone; Erythromycin base; Erythromycin ethylsuccinate; and Hydrocodone-acetaminophen   Social History   Social History  . Marital Status: Married    Spouse Name: N/A  . Number of Children: N/A  . Years of Education: N/A   Occupational History  . [employer] Department Division Director    Social History Main Topics  . Smoking status: Former Smoker -- 1.00 packs/day    Quit date: 09/04/1993  . Smokeless tobacco: Never Used  .  Alcohol Use: Yes     Comment: occ single drink  . Drug Use: No  . Sexual Activity: Not Asked   Other Topics Concern  . None   Social History Narrative   Walks trails frequently at work. Lives with wife, 3 children.     Family History:  The patient's family history includes Diabetes in his father and mother; Hypertension in his mother.   ROS:   Please see the history of present illness.    ROS All other systems reviewed and are negative.   PHYSICAL EXAM:   VS:  BP 118/90 mmHg  Pulse 68  Ht 5\' 10"  (1.778 m)  Wt 237 lb 9.6 oz (107.775 kg)  BMI 34.09 kg/m2   GEN: Well nourished, well developed, in no  acute distress HEENT: normal Neck: no JVD, carotid bruits, or masses Cardiac: RRR; no murmurs, rubs, or gallops,no edema.  Intact distal pulses bilaterally.  Respiratory:  clear to auscultation bilaterally, normal work of breathing GI: soft, nontender, nondistended, + BS MS: no deformity or atrophy Skin: warm and dry, no rash Neuro:  Alert and Oriented x 3, Strength and sensation are intact Psych: euthymic mood, full affect  Wt Readings from Last 3 Encounters:  02/01/16 237 lb 9.6 oz (107.775 kg)  07/23/15 234 lb 8 oz (106.369 kg)  01/07/15 223 lb 1.9 oz (101.207 kg)      Studies/Labs Reviewed:   EKG:  EKG is ordered today and showed NSR at 68bpm with no ST changes  Recent Labs: 07/23/2015: ALT 45 08/12/2015: BUN 17; Creat 0.99; Potassium 3.6; Sodium 138   Lipid Panel    Component Value Date/Time   CHOL 215* 07/23/2015 0919   TRIG 128 07/23/2015 0919   HDL 42 07/23/2015 0919   CHOLHDL 5.1* 07/23/2015 0919   VLDL 26 07/23/2015 0919   LDLCALC 147* 07/23/2015 0919    Additional studies/ records that were reviewed today include:  none    ASSESSMENT:    1. CAD in native artery   2. Essential hypertension   3. MI, old   4. Hyperlipidemia      PLAN:  In order of problems listed above:  1. ASCAD with remote MI and occluded OM and 80-90% PL branch stenosis - he denies any angina CP. Continue ASA/BB/Plavix.     2. HTN - BP controlled on current meds. Continue BB and diuretic.   3. Old MI - Continue BB 4.  Hyperlpidemia - he has been having some problems with back pain which resolved after he changed his statin to the am.  He then go tendinitis that seems to be lingering and so he stopped it again.  He is going to ease his way back onto the statin.  I will recheck an FLP and ALT in 8 weeks.    Medication Adjustments/Labs and Tests Ordered: Current medicines are reviewed at length with the patient today.  Concerns regarding medicines are outlined above.  Medication  changes, Labs and Tests ordered today are listed in the Patient Instructions below.  There are no Patient Instructions on file for this visit.   Signed, Armanda Magicraci Tatianna Ibbotson, MD  02/01/2016 3:54 PM    [facility] [address], [place], [place]  [address] Phone: [phone]; Fax: [phone]

## 2016-04-05 ENCOUNTER — Other Ambulatory Visit (INDEPENDENT_AMBULATORY_CARE_PROVIDER_SITE_OTHER): Payer: BLUE CROSS/BLUE SHIELD

## 2016-04-05 ENCOUNTER — Encounter (INDEPENDENT_AMBULATORY_CARE_PROVIDER_SITE_OTHER): Payer: Self-pay

## 2016-04-05 DIAGNOSIS — I2510 Atherosclerotic heart disease of native coronary artery without angina pectoris: Secondary | ICD-10-CM

## 2016-04-05 DIAGNOSIS — E785 Hyperlipidemia, unspecified: Secondary | ICD-10-CM

## 2016-04-05 LAB — LIPID PANEL
CHOL/HDL RATIO: 3.4 ratio (ref <=5.0)
Cholesterol: 176 mg/dL (ref 125–200)
HDL: 52 mg/dL (ref >=40)
LDL CALC: 105 mg/dL (ref <130)
TRIGLYCERIDES: 94 mg/dL (ref <150)
VLDL: 19 mg/dL (ref <30)

## 2016-04-05 LAB — HEPATIC FUNCTION PANEL
ALK PHOS: 87 U/L (ref 40–115)
ALT: 50 U/L — ABNORMAL HIGH (ref 9–46)
AST: 42 U/L — ABNORMAL HIGH (ref 10–40)
Albumin: 3.9 g/dL (ref 3.6–5.1)
BILIRUBIN DIRECT: 0.1 mg/dL (ref <=0.2)
BILIRUBIN INDIRECT: 0.4 mg/dL (ref 0.2–1.2)
TOTAL PROTEIN: 6.8 g/dL (ref 6.1–8.1)
Total Bilirubin: 0.5 mg/dL (ref 0.2–1.2)

## 2016-07-18 ENCOUNTER — Telehealth: Payer: Self-pay | Admitting: *Deleted

## 2016-07-18 ENCOUNTER — Other Ambulatory Visit: Payer: Self-pay | Admitting: *Deleted

## 2016-07-18 MED ORDER — CARVEDILOL 3.125 MG PO TABS: 3.1250 mg | ORAL_TABLET | Freq: Two times a day (BID) | ORAL | 4 refills | Status: DC

## 2016-07-18 NOTE — Telephone Encounter (Signed)
Patient requested a refill on nitro as he has not refilled this for some time. Nitro not on current med list. Okay to refill? Please advise. Thanks, MI

## 2016-07-18 NOTE — Telephone Encounter (Signed)
Ok to refill NTG.  

## 2016-07-19 ENCOUNTER — Other Ambulatory Visit: Payer: Self-pay | Admitting: *Deleted

## 2016-07-19 MED ORDER — NITROGLYCERIN 0.4 MG SL SUBL: 0.4000 mg | SUBLINGUAL_TABLET | SUBLINGUAL | 3 refills | Status: DC | PRN

## 2016-08-08 ENCOUNTER — Other Ambulatory Visit: Payer: Self-pay | Admitting: Cardiology

## 2016-09-19 ENCOUNTER — Encounter (INDEPENDENT_AMBULATORY_CARE_PROVIDER_SITE_OTHER): Payer: Self-pay

## 2016-09-19 ENCOUNTER — Ambulatory Visit (INDEPENDENT_AMBULATORY_CARE_PROVIDER_SITE_OTHER): Payer: BLUE CROSS/BLUE SHIELD | Admitting: Cardiology

## 2016-09-19 ENCOUNTER — Encounter: Payer: Self-pay | Admitting: Cardiology

## 2016-09-19 VITALS — BP 122/76 | HR 71 | Ht 70.0 in | Wt 245.0 lb

## 2016-09-19 DIAGNOSIS — E7800 Pure hypercholesterolemia, unspecified: Secondary | ICD-10-CM

## 2016-09-19 DIAGNOSIS — I10 Essential (primary) hypertension: Secondary | ICD-10-CM | POA: Diagnosis not present

## 2016-09-19 DIAGNOSIS — I2510 Atherosclerotic heart disease of native coronary artery without angina pectoris: Secondary | ICD-10-CM | POA: Diagnosis not present

## 2016-09-19 NOTE — Patient Instructions (Signed)

## 2016-09-19 NOTE — Progress Notes (Signed)
Cardiology Office Note    Date:  09/19/2016   ID:  Henry Sitterhomas B Cosner, DOB [birth year], MRN [id number]  PCP:  Carolynn ServeHINSON,JOHN, MD  Cardiologist:  Armanda Magicraci Turner, MD   Chief Complaint  Patient presents with  . Coronary Artery Disease  . Hypertension  . Hyperlipidemia    History of Present Illness:  Henry Howe is a 46 y.o. male with ahistory of NSTEMI and ASCAD with cath showing occluded OM and 80-90% PL branch stenosis with normal LVF. After reivew of films with interventionalist medical management was continued. He presents today for followup. He denies any further anginal CP, SOB, PND, orthopnea, LE edema, dizziness, palpitations or syncope.    Past Medical History:  Diagnosis Date  . Asthma   . CAD in native artery 07/29/2013   Cath 07/29/13: Occluded OM, 80% posterior lateral branch, mild luminal irregularities of LAD, normal EF-medical management  . Hyperlipidemia   . Peptic ulcer 2011    Past Surgical History:  Procedure Laterality Date  . ESOPHAGOGASTRODUODENOSCOPY     Hx ulcers  . KNEE SURGERY     x 3  . LEFT HEART CATHETERIZATION WITH CORONARY ANGIOGRAM N/A 07/29/2013   Procedure: LEFT HEART CATHETERIZATION WITH CORONARY ANGIOGRAM;  Surgeon: Donato SchultzMark Skains, MD;  [facility];  Service: Cardiovascular;  Laterality: N/A;    Current Medications: Outpatient Medications Prior to Visit  Medication Sig Dispense Refill  . albuterol (ACCUNEB) 0.63 MG/3ML nebulizer solution Take 1 ampule by nebulization every 6 (six) hours as needed for wheezing or shortness of breath.    Marland Kitchen. aspirin EC 81 MG EC tablet Take 1 tablet (81 mg total) by mouth daily.    Marland Kitchen. atorvastatin (LIPITOR) 80 MG tablet TAKE 1 TABLET (80 MG TOTAL) BY MOUTH DAILY AT 6 PM. 30 tablet 5  . carvedilol (COREG) 3.125 MG tablet Take 1 tablet (3.125 mg total) by mouth 2 (two) times daily. 60 tablet 4  . clopidogrel (PLAVIX) 75 MG tablet Take 1 tablet (75 mg total) by mouth daily with breakfast. 30 tablet 7    . hydrochlorothiazide (HYDRODIURIL) 25 MG tablet Take 1 tablet (25 mg total) by mouth daily. 30 tablet 7  . nitroGLYCERIN (NITROSTAT) 0.4 MG SL tablet Place 1 tablet (0.4 mg total) under the tongue every 5 (five) minutes as needed for chest pain. 25 tablet 3  . potassium chloride SA (K-DUR,KLOR-CON) 20 MEQ tablet Take 1 tablet (20 mEq total) by mouth daily. 30 tablet 11   No facility-administered medications prior to visit.      Allergies:   Erythromycin; Hydrocodone; Erythromycin base; Erythromycin ethylsuccinate; and Hydrocodone-acetaminophen   Social History   Social History  . Marital status: Married    Spouse name: N/A  . Number of children: N/A  . Years of education: N/A   Occupational History  . [employer] Department Division Director    Social History Main Topics  . Smoking status: Former Smoker    Packs/day: 1.00    Quit date: 09/04/1993  . Smokeless tobacco: Never Used  . Alcohol use Yes     Comment: occ single drink  . Drug use: No  . Sexual activity: Not Asked   Other Topics Concern  . None   Social History Narrative   Walks trails frequently at work. Lives with wife, 3 children.     Family History:  The patient's family history includes Diabetes in his father and mother; Hypertension in his mother.   ROS:   Please see the history of  present illness.    ROS All other systems reviewed and are negative.  No flowsheet data found.     PHYSICAL EXAM:   VS:  BP 122/76   Pulse 71   Ht 5\' 10"  (1.778 m)   Wt 245 lb (111.1 kg)   BMI 35.15 kg/m    GEN: Well nourished, well developed, in no acute distress  HEENT: normal  Neck: no JVD, carotid bruits, or masses Cardiac: RRR; no murmurs, rubs, or gallops,no edema.  Intact distal pulses bilaterally.  Respiratory:  clear to auscultation bilaterally, normal work of breathing GI: soft, nontender, nondistended, + BS MS: no deformity or atrophy  Skin: warm and dry, no rash Neuro:  Alert and Oriented x 3, Strength and  sensation are intact Psych: euthymic mood, full affect  Wt Readings from Last 3 Encounters:  09/19/16 245 lb (111.1 kg)  02/01/16 237 lb 9.6 oz (107.8 kg)  07/23/15 234 lb 8 oz (106.4 kg)      Studies/Labs Reviewed:   EKG:  EKG is not ordered today.   Recent Labs: 04/05/2016: ALT 50   Lipid Panel    Component Value Date/Time   CHOL 176 04/05/2016 0825   TRIG 94 04/05/2016 0825   HDL 52 04/05/2016 0825   CHOLHDL 3.4 04/05/2016 0825   VLDL 19 04/05/2016 0825   LDLCALC 105 04/05/2016 0825    Additional studies/ records that were reviewed today include:  none    ASSESSMENT:    1. CAD in native artery   2. Essential hypertension   3. Pure hypercholesterolemia      PLAN:  In order of problems listed above:  1. ASCAD s/p remote MI with occluded OM and 80-90% PL branch stenosis on medical management.  He has not had any anginal symptoms.  Continue ASA/Plavix/statin and BB.  He is walking 10 miles daily.   2. HTN - BP controlled on current meds.  Continue BB/diuretic.  I will check a BMET. 3. Hyperlipidemia with LDL goal < 70.  Continue statin.  Check FLP and ALT    Medication Adjustments/Labs and Tests Ordered: Current medicines are reviewed at length with the patient today.  Concerns regarding medicines are outlined above.  Medication changes, Labs and Tests ordered today are listed in the Patient Instructions below.  There are no Patient Instructions on file for this visit.   Signed, Armanda Magic, MD  09/19/2016 3:22 PM    [facility] [address] [place], [place], [place]  [address] Phone: [phone]; Fax: [phone]

## 2016-09-25 ENCOUNTER — Other Ambulatory Visit: Payer: BLUE CROSS/BLUE SHIELD | Admitting: *Deleted

## 2016-09-25 DIAGNOSIS — E7800 Pure hypercholesterolemia, unspecified: Secondary | ICD-10-CM

## 2016-09-25 DIAGNOSIS — I2510 Atherosclerotic heart disease of native coronary artery without angina pectoris: Secondary | ICD-10-CM

## 2016-09-25 DIAGNOSIS — I10 Essential (primary) hypertension: Secondary | ICD-10-CM

## 2016-09-26 LAB — LIPID PANEL
CHOLESTEROL TOTAL: 229 mg/dL — AB (ref 100–199)
Chol/HDL Ratio: 4.9 ratio units (ref 0.0–5.0)
HDL: 47 mg/dL (ref >39)
LDL Calculated: 160 mg/dL — ABNORMAL HIGH (ref 0–99)
Triglycerides: 112 mg/dL (ref 0–149)
VLDL Cholesterol Cal: 22 mg/dL (ref 5–40)

## 2016-09-26 LAB — BASIC METABOLIC PANEL
BUN / CREAT RATIO: 13 (ref 9–20)
BUN: 15 mg/dL (ref 6–24)
CALCIUM: 9.9 mg/dL (ref 8.7–10.2)
CO2: 27 mmol/L (ref 18–29)
Chloride: 98 mmol/L (ref 96–106)
Creatinine, Ser: 1.14 mg/dL (ref 0.76–1.27)
GFR, EST AFRICAN AMERICAN: 89 mL/min/{1.73_m2} (ref >59)
GFR, EST NON AFRICAN AMERICAN: 77 mL/min/{1.73_m2} (ref >59)
Glucose: 111 mg/dL — ABNORMAL HIGH (ref 65–99)
POTASSIUM: 4 mmol/L (ref 3.5–5.2)
Sodium: 141 mmol/L (ref 134–144)

## 2016-09-26 LAB — HEPATIC FUNCTION PANEL
ALT: 84 IU/L — AB (ref 0–44)
AST: 54 IU/L — AB (ref 0–40)
Albumin: 4.3 g/dL (ref 3.5–5.5)
Alkaline Phosphatase: 102 IU/L (ref 39–117)
BILIRUBIN TOTAL: 0.8 mg/dL (ref 0.0–1.2)
BILIRUBIN, DIRECT: 0.19 mg/dL (ref 0.00–0.40)
Total Protein: 7.6 g/dL (ref 6.0–8.5)

## 2016-09-28 ENCOUNTER — Telehealth: Payer: Self-pay

## 2016-09-28 DIAGNOSIS — E785 Hyperlipidemia, unspecified: Secondary | ICD-10-CM

## 2016-09-28 DIAGNOSIS — R748 Abnormal levels of other serum enzymes: Secondary | ICD-10-CM

## 2016-09-28 MED ORDER — ATORVASTATIN CALCIUM 40 MG PO TABS: 40.0000 mg | ORAL_TABLET | Freq: Every day | ORAL | 11 refills | Status: DC

## 2016-09-28 NOTE — Telephone Encounter (Signed)
Informed patient of results and verbal understanding expressed.  Instructed patient to DECREASE LIPITOR to 40 mg daily. FLP and LFTs scheduled 2/23. GI referral placed. Patient agrees with treatment plan.

## 2016-09-28 NOTE — Telephone Encounter (Signed)
-----   Message from Quintella Reichertraci R Turner, MD sent at 09/28/2016  2:39 PM EST ----- I would prefer that his Lipitor go down to 40mg  daily and repeat FLP and ALT in 4 weeks.  Also refer to GI to evaluate for fatty liver

## 2016-09-29 ENCOUNTER — Encounter: Payer: Self-pay | Admitting: Gastroenterology

## 2016-10-27 ENCOUNTER — Encounter (INDEPENDENT_AMBULATORY_CARE_PROVIDER_SITE_OTHER): Payer: Self-pay

## 2016-10-27 ENCOUNTER — Other Ambulatory Visit: Payer: BLUE CROSS/BLUE SHIELD | Admitting: *Deleted

## 2016-10-27 DIAGNOSIS — E785 Hyperlipidemia, unspecified: Secondary | ICD-10-CM

## 2016-10-27 LAB — LIPID PANEL
CHOLESTEROL TOTAL: 203 mg/dL — AB (ref 100–199)
Chol/HDL Ratio: 4.4 ratio units (ref 0.0–5.0)
HDL: 46 mg/dL (ref >39)
LDL CALC: 133 mg/dL — AB (ref 0–99)
TRIGLYCERIDES: 118 mg/dL (ref 0–149)
VLDL Cholesterol Cal: 24 mg/dL (ref 5–40)

## 2016-10-27 LAB — HEPATIC FUNCTION PANEL
ALK PHOS: 87 IU/L (ref 39–117)
ALT: 61 IU/L — ABNORMAL HIGH (ref 0–44)
AST: 45 IU/L — ABNORMAL HIGH (ref 0–40)
Albumin: 4 g/dL (ref 3.5–5.5)
Bilirubin Total: 0.5 mg/dL (ref 0.0–1.2)
Bilirubin, Direct: 0.16 mg/dL (ref 0.00–0.40)
TOTAL PROTEIN: 6.7 g/dL (ref 6.0–8.5)

## 2016-11-11 ENCOUNTER — Other Ambulatory Visit: Payer: Self-pay | Admitting: Cardiology

## 2016-11-13 ENCOUNTER — Encounter: Payer: Self-pay | Admitting: Gastroenterology

## 2016-11-13 ENCOUNTER — Ambulatory Visit (INDEPENDENT_AMBULATORY_CARE_PROVIDER_SITE_OTHER): Payer: BLUE CROSS/BLUE SHIELD | Admitting: Gastroenterology

## 2016-11-13 ENCOUNTER — Encounter (INDEPENDENT_AMBULATORY_CARE_PROVIDER_SITE_OTHER): Payer: BLUE CROSS/BLUE SHIELD

## 2016-11-13 VITALS — BP 110/80 | HR 72 | Ht 68.5 in | Wt 245.4 lb

## 2016-11-13 DIAGNOSIS — E669 Obesity, unspecified: Secondary | ICD-10-CM

## 2016-11-13 DIAGNOSIS — K760 Fatty (change of) liver, not elsewhere classified: Secondary | ICD-10-CM | POA: Diagnosis not present

## 2016-11-13 DIAGNOSIS — Z6836 Body mass index (BMI) 36.0-36.9, adult: Secondary | ICD-10-CM | POA: Diagnosis not present

## 2016-11-13 DIAGNOSIS — R935 Abnormal findings on diagnostic imaging of other abdominal regions, including retroperitoneum: Secondary | ICD-10-CM | POA: Diagnosis not present

## 2016-11-13 LAB — HEPATITIS C ANTIBODY: HCV Ab: NEGATIVE

## 2016-11-13 LAB — PROTIME-INR
INR: 1 ratio (ref 0.8–1.0)
Prothrombin Time: 10.5 s (ref 9.6–13.1)

## 2016-11-13 LAB — HEPATITIS A ANTIBODY, TOTAL: Hep A Total Ab: NONREACTIVE

## 2016-11-13 LAB — HEMOGLOBIN A1C: Hgb A1c MFr Bld: 5.9 % (ref 4.6–6.5)

## 2016-11-13 LAB — IBC PANEL
IRON: 105 ug/dL (ref 42–165)
SATURATION RATIOS: 30.2 % (ref 20.0–50.0)
TRANSFERRIN: 248 mg/dL (ref 212.0–360.0)

## 2016-11-13 LAB — HEPATITIS B SURFACE ANTIGEN: Hepatitis B Surface Ag: NEGATIVE

## 2016-11-13 LAB — HEPATITIS B SURFACE ANTIBODY,QUALITATIVE: Hep B S Ab: POSITIVE — AB

## 2016-11-13 NOTE — Patient Instructions (Signed)
If you are age 47 or older, your body mass index should be between 23-30. Your Body mass index is 36.76 kg/m. If this is out of the aforementioned range listed, please consider follow up with your Primary Care Provider.  If you are age 47 or younger, your body mass index should be between 19-25. Your Body mass index is 36.76 kg/m. If this is out of the aformentioned range listed, please consider follow up with your Primary Care Provider.   Your physician has requested that you go to the basement for lab work before leaving today.  Thank you.

## 2016-11-13 NOTE — Progress Notes (Signed)
HPI :  46 y/o male with a history of CAD on plavix, HLD, reported PUD, asthma, referred for elevated liver function testing.   Per review of chart there has been an AST and ALT elevation upwards value of 80s since 2014. Most recently ALT on 10/27/16 was 61, with AST of 45. He has had fatty liver noted on remote US in 2010. BMI is 36.  He is concerned that his LFT abnormality may be related to his statin, he has been on it for 4 years, he has been taking intermittent dosing over time. In fact he states he doesn't like taking medication, taking his medications only once per week, on Mondays, including taking plavix once per week. He denies any herbal supplements or OTCs. He reports transient elevated liver enzymes in his 6720s due to medications.  No FH of liver disease.  No history of jaundice. No tattoos. No prior blood transfusion.  He has rare alcohol use, one-two drinks per month.  He thinks he has gained 10 lbs in the past year.   He has an occasional abdominal pain. He reports history of PUD. He has mid abdominal pains at times, he states when he takes prilosec it takes it away. He denies any routine heartburn. He reports a prior episode of ? Pancreatitis in 2010 for which he underwent a CT scan as outlined. He has not had a follow up CT scan.   Prior evaluation: US 03/2009 - fatty infiltration of the liver CT scan 03/09/09 - increased density of the pancreatic head, no ductal dilation, fatty liver - radiology recommended follow up CT Scan after resolution HIDA scan 03/19/09 - normal  Past Medical History:  Diagnosis Date  . Arthritis   . Asthma   . CAD in native artery 07/29/2013   Cath 07/29/13: Occluded OM, 80% posterior lateral branch, mild luminal irregularities of LAD, normal EF-medical management  . CHF (congestive heart failure) (HCC)   . Hyperlipidemia   . Peptic ulcer 2011     Past Surgical History:  Procedure Laterality Date  . ESOPHAGOGASTRODUODENOSCOPY     Hx ulcers    . KNEE SURGERY Bilateral    1 left, 2 right  . LEFT HEART CATHETERIZATION WITH CORONARY ANGIOGRAM N/A 07/29/2013   Procedure: LEFT HEART CATHETERIZATION WITH CORONARY ANGIOGRAM;  Surgeon: Donato SchultzMark Skains, MD;  [facility];  Service: Cardiovascular;  Laterality: N/A;   Family History  Problem Relation Age of Onset  . Diabetes Father   . Heart disease Father   . Lung cancer Father   . Hypertension Mother   . Diabetes Mother   . Cancer Mother     type unknown, Mets  . Heart disease Paternal Uncle   . Cancer Paternal Uncle     x 2, type uknown   Social History  Substance Use Topics  . Smoking status: Former Smoker    Packs/day: 1.00    Quit date: 11/13/1989  . Smokeless tobacco: Never Used  . Alcohol use Yes     Comment: occ single drink   Current Outpatient Prescriptions  Medication Sig Dispense Refill  . albuterol (ACCUNEB) 0.63 MG/3ML nebulizer solution Take 1 ampule by nebulization every 6 (six) hours as needed for wheezing or shortness of breath.    Marland Kitchen. aspirin EC 81 MG EC tablet Take 1 tablet (81 mg total) by mouth daily.    Marland Kitchen. atorvastatin (LIPITOR) 40 MG tablet Take 1 tablet (40 mg total) by mouth daily at 6 PM. 30 tablet  11  . carvedilol (COREG) 3.125 MG tablet Take 1 tablet (3.125 mg total) by mouth 2 (two) times daily. 60 tablet 4  . clopidogrel (PLAVIX) 75 MG tablet Take 1 tablet (75 mg total) by mouth daily with breakfast. 30 tablet 7  . potassium chloride SA (K-DUR,KLOR-CON) 20 MEQ tablet Take 1 tablet (20 mEq total) by mouth daily. 30 tablet 11  . hydrochlorothiazide (HYDRODIURIL) 25 MG tablet TAKE 1 TABLET (25 MG TOTAL) BY MOUTH DAILY. 30 tablet 9  . nitroGLYCERIN (NITROSTAT) 0.4 MG SL tablet Place 1 tablet (0.4 mg total) under the tongue every 5 (five) minutes as needed for chest pain. (Patient not taking: Reported on 11/13/2016) 25 tablet 3   No current facility-administered medications for this visit.    Allergies  Allergen Reactions  . Erythromycin Other  (See Comments)    Does not want to take Liver failure  . Hydrocodone Nausea And Vomiting  . Erythromycin Base Other (See Comments)    Unknown  . Erythromycin Ethylsuccinate Other (See Comments)    unknown  . Hydrocodone-Acetaminophen Nausea And Vomiting     Review of Systems: All systems reviewed and negative except where noted in HPI.   Lab Results  Component Value Date   WBC 15.5 (H) 07/30/2013   HGB 14.7 07/30/2013   HCT 41.9 07/30/2013   MCV 97.9 07/30/2013   PLT 238 07/30/2013   Lab Results  Component Value Date   ALT 61 (H) 10/27/2016   AST 45 (H) 10/27/2016   ALKPHOS 87 10/27/2016   BILITOT 0.5 10/27/2016   Lab Results  Component Value Date   CREATININE 1.14 09/25/2016   BUN 15 09/25/2016   NA 141 09/25/2016   K 4.0 09/25/2016   CL 98 09/25/2016   CO2 27 09/25/2016     Physical Exam: BP 110/80 (BP Location: Left Arm, Patient Position: Sitting, Cuff Size: Normal)   Pulse 72   Ht 5' 8.5" (1.74 m) Comment: height measured without shoes  Wt 245 lb 6 oz (111.3 kg)   BMI 36.76 kg/m  Constitutional: Pleasant,well-developed, male in no acute distress. HEENT: Normocephalic and atraumatic. Conjunctivae are normal. No scleral icterus. Neck supple.  Cardiovascular: Normal rate, regular rhythm.  Pulmonary/chest: Effort normal and breath sounds normal. No wheezing, rales or rhonchi. Abdominal: Soft, protuberant, nontender. . There are no masses palpable. No hepatomegaly. Extremities: no edema Lymphadenopathy: No cervical adenopathy noted. Neurological: Alert and oriented to person place and time. Skin: Skin is warm and dry. No rashes noted. Psychiatric: Normal mood and affect. Behavior is normal.   ASSESSMENT AND PLAN: 46 year old male with history as outlined above here for assessment of the following issues:  Elevated ALT / fatty liver - I suspect his elevated ALT is very likely due to fatty liver. However given the chronicity of this elevation on  recommending labs to rule out other forms of chronic liver disease. He agreed to go to the lab for this today. Otherwise I discussed the spectrum of fatty liver disease with him at length including long-term risks of Elita Boone and cirrhosis. Mainstay of therapy is to lose weight and normalize BMI. We discussed this for a bit. He states he will "never go on a diet", and will "eat whatever he wants". I recommend he minimize his alcohol use, and otherwise encourage routine coffee use which can help prevent fibrotic changes. I will screen for diabetes to ensure he does not have this as well. I don't think his chronic ALT elevation is due to statins  and he can continue this. I'll otherwise await the results of labs and let him know the results. He should have his LFTs checked at least yearly.  Obesity - as above we discussed this, he declines referral to nutritionist, declines trial of diet  Abnormal pancreas imaging - suspect prior changes may be due to pancreatitis, however radiology recommended a follow-up imaging the pancreas to ensure normal which is not done. I recommended this for him but he declined today, while verbalizing understanding of my rationale for recommending the test. He can think about it and let me know if he reconsiders.  Ileene Patrick, MD [facility] Pager [phone]  CC: Carolynn Serve, MD

## 2016-11-14 LAB — IGG: IGG (IMMUNOGLOBIN G), SERUM: 1231 mg/dL (ref 694–1618)

## 2016-11-14 LAB — ANA: ANA: NEGATIVE

## 2016-11-14 LAB — FERRITIN: Ferritin: 167.6 ng/mL (ref 22.0–322.0)

## 2016-11-15 LAB — ANTI-SMOOTH MUSCLE ANTIBODY, IGG

## 2016-11-15 LAB — ALPHA-1-ANTITRYPSIN: A1 ANTITRYPSIN SER: 155 mg/dL (ref 83–199)

## 2016-11-15 LAB — CERULOPLASMIN: Ceruloplasmin: 25 mg/dL (ref 18–36)

## 2016-11-16 ENCOUNTER — Telehealth: Payer: Self-pay

## 2016-11-16 NOTE — Telephone Encounter (Signed)
-----   Message from Ruffin FrederickSteven Paul Armbruster, MD sent at 11/15/2016  6:39 PM EDT ----- Morrie SheldonAshley can you please let this patient know his labs are normal in regards to chronic liver diseases. I suspect his elevated ALT is coming from fatty liver as I discussed with him.  He is immune to hepatitis B but is NOT immune to hepatitis A. Can you please call him to coordinate hepatitis A vaccine. Thanks

## 2016-11-16 NOTE — Telephone Encounter (Signed)
Pt informed of results and understood. He will come in to have Hep A vaccine. He wants to have a morning appt. I will send him a letter to inform of appt. He does not have my chart and does not have his calendar with him. He will call back if appt date/time does not work.  Hep A order placed and pt will be contacted via letter.

## 2016-11-28 ENCOUNTER — Ambulatory Visit (INDEPENDENT_AMBULATORY_CARE_PROVIDER_SITE_OTHER): Payer: BLUE CROSS/BLUE SHIELD | Admitting: Gastroenterology

## 2016-11-28 ENCOUNTER — Other Ambulatory Visit: Payer: Self-pay

## 2016-11-28 DIAGNOSIS — Z23 Encounter for immunization: Secondary | ICD-10-CM

## 2016-11-28 NOTE — Progress Notes (Signed)
Patient received 1st of Hep. A injections today. Patient tolerated this well. Patient waited in office 15 minutes, doing well and allowed to leave. Patient given reminder card when next injection is due (06/01/17), told patient I will put a recall in our system to schedule this next visit.

## 2016-12-11 ENCOUNTER — Other Ambulatory Visit: Payer: Self-pay | Admitting: *Deleted

## 2016-12-11 MED ORDER — ATORVASTATIN CALCIUM 40 MG PO TABS: 40.0000 mg | ORAL_TABLET | Freq: Every day | ORAL | 2 refills | Status: DC

## 2016-12-13 ENCOUNTER — Telehealth: Payer: Self-pay

## 2016-12-13 DIAGNOSIS — E785 Hyperlipidemia, unspecified: Secondary | ICD-10-CM

## 2016-12-13 NOTE — Telephone Encounter (Signed)
-----   Message from Ruffin Frederick, MD sent at 11/23/2016  7:42 AM EDT ----- Hello, Thanks for the note. Yes for his metabolic syndrome I think a statin is a good idea for him and if LFTs due to fatty liver, hopefully this does not cause any problems. I would repeat LFTs 4-6 weeks after resuming statin to ensure stable. Thanks

## 2016-12-13 NOTE — Telephone Encounter (Signed)
Patient states he already restarted Lipitor 40 mg MWF. He states it takes it "on the days he takes his other medications."  Reiterated to patient the importance of taking his medications daily as directed. He states he knows he is supposed to take his medications daily but is "just not going to do it." Offered to schedule patient for an OV with a provider to discuss necessity for medication compliance in more detail and he declined. LFTs scheduled 5/11. Patient agrees with treatment plan and med list updated.

## 2016-12-27 ENCOUNTER — Other Ambulatory Visit: Payer: Self-pay | Admitting: Cardiology

## 2017-01-12 ENCOUNTER — Encounter (INDEPENDENT_AMBULATORY_CARE_PROVIDER_SITE_OTHER): Payer: Self-pay

## 2017-01-12 ENCOUNTER — Other Ambulatory Visit: Payer: BLUE CROSS/BLUE SHIELD | Admitting: *Deleted

## 2017-01-12 DIAGNOSIS — E785 Hyperlipidemia, unspecified: Secondary | ICD-10-CM

## 2017-01-12 LAB — HEPATIC FUNCTION PANEL
ALBUMIN: 4.2 g/dL (ref 3.5–5.5)
ALT: 54 IU/L — ABNORMAL HIGH (ref 0–44)
AST: 42 IU/L — ABNORMAL HIGH (ref 0–40)
Alkaline Phosphatase: 98 IU/L (ref 39–117)
Bilirubin Total: 0.8 mg/dL (ref 0.0–1.2)
Bilirubin, Direct: 0.23 mg/dL (ref 0.00–0.40)
TOTAL PROTEIN: 6.9 g/dL (ref 6.0–8.5)

## 2017-04-27 ENCOUNTER — Encounter: Payer: Self-pay | Admitting: Gastroenterology

## 2017-07-23 DIAGNOSIS — K859 Acute pancreatitis without necrosis or infection, unspecified: Principal | ICD-10-CM | POA: Diagnosis present

## 2017-07-23 DIAGNOSIS — Z87891 Personal history of nicotine dependence: Secondary | ICD-10-CM

## 2017-07-23 DIAGNOSIS — I509 Heart failure, unspecified: Secondary | ICD-10-CM | POA: Diagnosis present

## 2017-07-23 DIAGNOSIS — Z885 Allergy status to narcotic agent status: Secondary | ICD-10-CM

## 2017-07-23 DIAGNOSIS — J45909 Unspecified asthma, uncomplicated: Secondary | ICD-10-CM | POA: Diagnosis present

## 2017-07-23 DIAGNOSIS — Z7902 Long term (current) use of antithrombotics/antiplatelets: Secondary | ICD-10-CM

## 2017-07-23 DIAGNOSIS — Z8711 Personal history of peptic ulcer disease: Secondary | ICD-10-CM

## 2017-07-23 DIAGNOSIS — Z881 Allergy status to other antibiotic agents status: Secondary | ICD-10-CM

## 2017-07-23 DIAGNOSIS — Z8249 Family history of ischemic heart disease and other diseases of the circulatory system: Secondary | ICD-10-CM

## 2017-07-23 DIAGNOSIS — E785 Hyperlipidemia, unspecified: Secondary | ICD-10-CM | POA: Diagnosis present

## 2017-07-23 DIAGNOSIS — I2510 Atherosclerotic heart disease of native coronary artery without angina pectoris: Secondary | ICD-10-CM | POA: Diagnosis present

## 2017-07-23 DIAGNOSIS — Z801 Family history of malignant neoplasm of trachea, bronchus and lung: Secondary | ICD-10-CM

## 2017-07-23 DIAGNOSIS — Z833 Family history of diabetes mellitus: Secondary | ICD-10-CM

## 2017-07-23 DIAGNOSIS — I110 Hypertensive heart disease with heart failure: Secondary | ICD-10-CM | POA: Diagnosis present

## 2017-07-23 DIAGNOSIS — M1990 Unspecified osteoarthritis, unspecified site: Secondary | ICD-10-CM | POA: Diagnosis present

## 2017-07-23 DIAGNOSIS — Z7982 Long term (current) use of aspirin: Secondary | ICD-10-CM

## 2017-07-23 DIAGNOSIS — R109 Unspecified abdominal pain: Secondary | ICD-10-CM | POA: Diagnosis not present

## 2017-07-23 DIAGNOSIS — I252 Old myocardial infarction: Secondary | ICD-10-CM

## 2017-07-23 LAB — URINALYSIS, COMPLETE (UACMP) WITH MICROSCOPIC
BACTERIA UA: NONE SEEN
BILIRUBIN URINE: NEGATIVE
Glucose, UA: NEGATIVE mg/dL
KETONES UR: NEGATIVE mg/dL
LEUKOCYTES UA: NEGATIVE
NITRITE: NEGATIVE
PH: 6 (ref 5.0–8.0)
Protein, ur: 30 mg/dL — AB
SQUAMOUS EPITHELIAL / LPF: NONE SEEN
Specific Gravity, Urine: 1.025 (ref 1.005–1.030)

## 2017-07-23 LAB — CBC
HEMATOCRIT: 41.1 % (ref 40.0–52.0)
Hemoglobin: 14.3 g/dL (ref 13.0–18.0)
MCH: 33.8 pg (ref 26.0–34.0)
MCHC: 34.9 g/dL (ref 32.0–36.0)
MCV: 96.9 fL (ref 80.0–100.0)
Platelets: 245 10*3/uL (ref 150–440)
RBC: 4.24 MIL/uL — AB (ref 4.40–5.90)
RDW: 13.2 % (ref 11.5–14.5)
WBC: 16.1 10*3/uL — AB (ref 3.8–10.6)

## 2017-07-23 NOTE — ED Triage Notes (Signed)
Patient c/o mid/upper abdominal pain beginning Friday. Patient reports hx of the same. Patient reports previous dx of ulcer. Patient denies N/V/D

## 2017-07-24 ENCOUNTER — Other Ambulatory Visit: Payer: Self-pay

## 2017-07-24 ENCOUNTER — Emergency Department: Payer: BLUE CROSS/BLUE SHIELD

## 2017-07-24 ENCOUNTER — Encounter: Payer: Self-pay | Admitting: Radiology

## 2017-07-24 ENCOUNTER — Inpatient Hospital Stay
Admission: EM | Admit: 2017-07-24 | Discharge: 2017-07-26 | DRG: 440 | Disposition: A | Discharge status: Home / Self Care | Payer: BLUE CROSS/BLUE SHIELD | Attending: Internal Medicine | Admitting: Internal Medicine

## 2017-07-24 DIAGNOSIS — K859 Acute pancreatitis without necrosis or infection, unspecified: Secondary | ICD-10-CM | POA: Diagnosis present

## 2017-07-24 DIAGNOSIS — J45909 Unspecified asthma, uncomplicated: Secondary | ICD-10-CM | POA: Diagnosis present

## 2017-07-24 DIAGNOSIS — M1990 Unspecified osteoarthritis, unspecified site: Secondary | ICD-10-CM | POA: Diagnosis present

## 2017-07-24 DIAGNOSIS — Z87891 Personal history of nicotine dependence: Secondary | ICD-10-CM | POA: Diagnosis not present

## 2017-07-24 DIAGNOSIS — I252 Old myocardial infarction: Secondary | ICD-10-CM | POA: Diagnosis not present

## 2017-07-24 DIAGNOSIS — I110 Hypertensive heart disease with heart failure: Secondary | ICD-10-CM | POA: Diagnosis present

## 2017-07-24 DIAGNOSIS — Z7982 Long term (current) use of aspirin: Secondary | ICD-10-CM | POA: Diagnosis not present

## 2017-07-24 DIAGNOSIS — Z8249 Family history of ischemic heart disease and other diseases of the circulatory system: Secondary | ICD-10-CM | POA: Diagnosis not present

## 2017-07-24 DIAGNOSIS — Z7902 Long term (current) use of antithrombotics/antiplatelets: Secondary | ICD-10-CM | POA: Diagnosis not present

## 2017-07-24 DIAGNOSIS — Z881 Allergy status to other antibiotic agents status: Secondary | ICD-10-CM | POA: Diagnosis not present

## 2017-07-24 DIAGNOSIS — I2510 Atherosclerotic heart disease of native coronary artery without angina pectoris: Secondary | ICD-10-CM | POA: Diagnosis present

## 2017-07-24 DIAGNOSIS — R109 Unspecified abdominal pain: Secondary | ICD-10-CM | POA: Diagnosis present

## 2017-07-24 DIAGNOSIS — Z833 Family history of diabetes mellitus: Secondary | ICD-10-CM | POA: Diagnosis not present

## 2017-07-24 DIAGNOSIS — E785 Hyperlipidemia, unspecified: Secondary | ICD-10-CM | POA: Diagnosis present

## 2017-07-24 DIAGNOSIS — Z885 Allergy status to narcotic agent status: Secondary | ICD-10-CM | POA: Diagnosis not present

## 2017-07-24 DIAGNOSIS — Z8711 Personal history of peptic ulcer disease: Secondary | ICD-10-CM | POA: Diagnosis not present

## 2017-07-24 DIAGNOSIS — I509 Heart failure, unspecified: Secondary | ICD-10-CM | POA: Diagnosis present

## 2017-07-24 DIAGNOSIS — Z801 Family history of malignant neoplasm of trachea, bronchus and lung: Secondary | ICD-10-CM | POA: Diagnosis not present

## 2017-07-24 HISTORY — DX: Acute myocardial infarction, unspecified: I21.9

## 2017-07-24 LAB — COMPREHENSIVE METABOLIC PANEL
ALK PHOS: 86 U/L (ref 38–126)
ALT: 43 U/L (ref 17–63)
ANION GAP: 9 (ref 5–15)
AST: 35 U/L (ref 15–41)
Albumin: 3.6 g/dL (ref 3.5–5.0)
BILIRUBIN TOTAL: 1.3 mg/dL — AB (ref 0.3–1.2)
BUN: 15 mg/dL (ref 6–20)
CALCIUM: 8.8 mg/dL — AB (ref 8.9–10.3)
CO2: 24 mmol/L (ref 22–32)
Chloride: 104 mmol/L (ref 101–111)
Creatinine, Ser: 0.97 mg/dL (ref 0.61–1.24)
GFR calc Af Amer: >60 mL/min (ref >60)
GFR calc non Af Amer: >60 mL/min (ref >60)
Glucose, Bld: 104 mg/dL — ABNORMAL HIGH (ref 65–99)
POTASSIUM: 3.5 mmol/L (ref 3.5–5.1)
Sodium: 137 mmol/L (ref 135–145)
TOTAL PROTEIN: 7.5 g/dL (ref 6.5–8.1)

## 2017-07-24 LAB — HEMOGLOBIN A1C
Hgb A1c MFr Bld: 5.7 % — ABNORMAL HIGH (ref 4.8–5.6)
Mean Plasma Glucose: 116.89 mg/dL

## 2017-07-24 LAB — TROPONIN I: Troponin I: <0.03 ng/mL (ref <0.03)

## 2017-07-24 LAB — TSH: TSH: 1.489 u[IU]/mL (ref 0.350–4.500)

## 2017-07-24 LAB — LIPASE, BLOOD: Lipase: 329 U/L — ABNORMAL HIGH (ref 11–51)

## 2017-07-24 MED ORDER — TOPIRAMATE 25 MG PO TABS
50.0000 mg | ORAL_TABLET | Freq: Two times a day (BID) | ORAL | Status: DC
  Administered 2017-07-24: 50 mg via ORAL
  Filled 2017-07-24 (×3): qty 2

## 2017-07-24 MED ORDER — PROCHLORPERAZINE EDISYLATE 5 MG/ML IJ SOLN
10.0000 mg | Freq: Four times a day (QID) | INTRAMUSCULAR | Status: DC | PRN
  Administered 2017-07-24 (×2): 10 mg via INTRAVENOUS
  Filled 2017-07-24 (×3): qty 2

## 2017-07-24 MED ORDER — MORPHINE SULFATE (PF) 2 MG/ML IV SOLN
2.0000 mg | Freq: Once | INTRAVENOUS | Status: AC
  Administered 2017-07-24: 2 mg via INTRAVENOUS
  Filled 2017-07-24: qty 1

## 2017-07-24 MED ORDER — MORPHINE SULFATE (PF) 2 MG/ML IV SOLN
1.0000 mg | INTRAVENOUS | Status: DC | PRN
  Administered 2017-07-24: 2 mg via INTRAVENOUS
  Administered 2017-07-24: 1 mg via INTRAVENOUS
  Administered 2017-07-24: 2 mg via INTRAVENOUS
  Filled 2017-07-24 (×3): qty 1

## 2017-07-24 MED ORDER — SODIUM CHLORIDE 0.9 % IV SOLN
INTRAVENOUS | Status: DC
  Administered 2017-07-24 – 2017-07-25 (×4): via INTRAVENOUS

## 2017-07-24 MED ORDER — ONDANSETRON HCL 4 MG/2ML IJ SOLN
4.0000 mg | Freq: Once | INTRAMUSCULAR | Status: AC
  Administered 2017-07-24: 4 mg via INTRAVENOUS

## 2017-07-24 MED ORDER — HYDROMORPHONE HCL 1 MG/ML IJ SOLN
1.0000 mg | Freq: Once | INTRAMUSCULAR | Status: AC
  Administered 2017-07-24: 1 mg via INTRAVENOUS
  Filled 2017-07-24: qty 1

## 2017-07-24 MED ORDER — ONDANSETRON HCL 4 MG/2ML IJ SOLN: 4.0000 mg | Freq: Four times a day (QID) | INTRAMUSCULAR | Status: DC | PRN

## 2017-07-24 MED ORDER — ACETAMINOPHEN 325 MG PO TABS
650.0000 mg | ORAL_TABLET | Freq: Four times a day (QID) | ORAL | Status: DC | PRN
  Administered 2017-07-25: 650 mg via ORAL
  Filled 2017-07-24: qty 2

## 2017-07-24 MED ORDER — ONDANSETRON HCL 4 MG/2ML IJ SOLN
4.0000 mg | Freq: Once | INTRAMUSCULAR | Status: AC
  Administered 2017-07-24: 4 mg via INTRAVENOUS
  Filled 2017-07-24: qty 2

## 2017-07-24 MED ORDER — ACETAMINOPHEN 650 MG RE SUPP
650.0000 mg | Freq: Four times a day (QID) | RECTAL | Status: DC | PRN
Start: 2017-07-24 — End: 2017-07-26

## 2017-07-24 MED ORDER — ONDANSETRON HCL 4 MG PO TABS: 4.0000 mg | ORAL_TABLET | Freq: Four times a day (QID) | ORAL | Status: DC | PRN

## 2017-07-24 MED ORDER — SODIUM CHLORIDE 0.9 % IV BOLUS (SEPSIS)
1000.0000 mL | Freq: Once | INTRAVENOUS | Status: AC
  Administered 2017-07-24: 1000 mL via INTRAVENOUS

## 2017-07-24 MED ORDER — IOPAMIDOL (ISOVUE-300) INJECTION 61%
125.0000 mL | Freq: Once | INTRAVENOUS | Status: AC | PRN
  Administered 2017-07-24: 125 mL via INTRAVENOUS

## 2017-07-24 MED ORDER — MORPHINE SULFATE (PF) 4 MG/ML IV SOLN
4.0000 mg | Freq: Once | INTRAVENOUS | Status: AC
  Administered 2017-07-24: 4 mg via INTRAVENOUS
  Filled 2017-07-24: qty 1

## 2017-07-24 MED ORDER — DOCUSATE SODIUM 100 MG PO CAPS
100.0000 mg | ORAL_CAPSULE | Freq: Two times a day (BID) | ORAL | Status: DC
  Administered 2017-07-24 – 2017-07-25 (×3): 100 mg via ORAL
  Filled 2017-07-24 (×5): qty 1

## 2017-07-24 MED ORDER — ONDANSETRON HCL 4 MG/2ML IJ SOLN
INTRAMUSCULAR | Status: AC
  Administered 2017-07-24: 4 mg via INTRAVENOUS
  Filled 2017-07-24: qty 2

## 2017-07-24 MED ORDER — ENOXAPARIN SODIUM 40 MG/0.4ML ~~LOC~~ SOLN
40.0000 mg | SUBCUTANEOUS | Status: DC
  Administered 2017-07-24 – 2017-07-25 (×2): 40 mg via SUBCUTANEOUS
  Filled 2017-07-24 (×3): qty 0.4

## 2017-07-24 NOTE — Progress Notes (Signed)
1        [facility] [place] [facility]   PATIENT NAME: Henry Howe    MR#:  [id number]  DATE OF BIRTH:  09-09-1970  SUBJECTIVE:  CHIEF COMPLAINT:   Chief Complaint  Patient presents with  . Abdominal Pain  Her pain is somewhat better than yesterday REVIEW OF SYSTEMS:  Review of Systems  Constitutional: Negative for chills, fever and weight loss.  HENT: Negative for nosebleeds and sore throat.   Eyes: Negative for blurred vision.  Respiratory: Negative for cough, shortness of breath and wheezing.   Cardiovascular: Negative for chest pain, orthopnea, leg swelling and PND.  Gastrointestinal: Positive for abdominal pain. Negative for constipation, diarrhea, heartburn, nausea and vomiting.  Genitourinary: Negative for dysuria and urgency.  Musculoskeletal: Negative for back pain.  Skin: Negative for rash.  Neurological: Negative for dizziness, speech change, focal weakness and headaches.  Endo/Heme/Allergies: Does not bruise/bleed easily.  Psychiatric/Behavioral: Negative for depression.    DRUG ALLERGIES:   Allergies  Allergen Reactions  . Erythromycin Other (See Comments)    Does not want to take Liver failure  . Hydrocodone Nausea And Vomiting  . Erythromycin Base Other (See Comments)    Unknown  . Erythromycin Ethylsuccinate Other (See Comments)    unknown  . Hydrocodone-Acetaminophen Nausea And Vomiting   VITALS:  Blood pressure 133/86, pulse 93, temperature 98.1 F (36.7 C), temperature source Oral, resp. rate 18, height 5\' 10"  (1.778 m), weight 110 kg (242 lb 8 oz), SpO2 94 %. PHYSICAL EXAMINATION:  Physical Exam  Constitutional: He is oriented to person, place, and time and well-developed, well-nourished, and in no distress.  HENT:  Head: Normocephalic and atraumatic.  Eyes: Conjunctivae and EOM are normal. Pupils are equal, round, and reactive to light.  Neck: Normal range of motion. Neck supple. No tracheal deviation present. No  thyromegaly present.  Cardiovascular: Normal rate, regular rhythm and normal heart sounds.  Pulmonary/Chest: Effort normal and breath sounds normal. No respiratory distress. He has no wheezes. He exhibits no tenderness.  Abdominal: Soft. Bowel sounds are normal. He exhibits no distension. There is no tenderness.  Musculoskeletal: Normal range of motion.  Neurological: He is alert and oriented to person, place, and time. No cranial nerve deficit.  Skin: Skin is warm and dry. No rash noted.  Psychiatric: Mood and affect normal.   LABORATORY PANEL:  Male CBC Recent Labs  Lab 07/23/17 2326  WBC 16.1*  HGB 14.3  HCT 41.1  PLT 245   ------------------------------------------------------------------------------------------------------------------ Chemistries  Recent Labs  Lab 07/23/17 2326  NA 137  K 3.5  CL 104  CO2 24  GLUCOSE 104*  BUN 15  CREATININE 0.97  CALCIUM 8.8*  AST 35  ALT 43  ALKPHOS 86  BILITOT 1.3*   RADIOLOGY:  [place] Abdomen Complete  Result Date: 07/24/2017 CLINICAL DATA:  46 year old male with right upper quadrant abdominal pain and nausea. EXAM: ABDOMEN ULTRASOUND COMPLETE COMPARISON:  Abdominal CT dated 03/09/2009, ultrasound dated 03/15/2009, and HIDA scan dated 03/19/2009 FINDINGS: Gallbladder: No gallstones or wall thickening visualized. No sonographic Murphy sign noted by sonographer. Common bile duct: Diameter: 4 mm Liver: There is diffuse fatty infiltration of the liver. Superimposed inflammation or fibrosis is not excluded. Clinical correlation is recommended. The visualized portion of the main portal vein appear patent. Hepatopetal flow. IVC: Not visualized and obscured by bowel gas. Pancreas: Not visualized and obscured by bowel gas. Spleen: Grossly unremarkable as visualized. Right Kidney: Length: 11.2 cm.  No hydronephrosis.  Left Kidney: Length: 11.6 cm. No hydronephrosis. Evaluation of the kidneys are limited due to suboptimal visualization. Abdominal  aorta: The aorta is only partially visualized with limited evaluation due to overlying bowel gas. The aorta measures up to 2.5 cm proximally. Other findings: None. IMPRESSION: 1. Fatty liver. 2. Unremarkable gallbladder. 3. 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 4. Patent main portal vein with hepatopetal flow. Electronically Signed   By: Elgie CollardArash  Radparvar M.D.   On: 07/24/2017 03:24   Ct Abdomen Pelvis W Contrast  Result Date: 07/24/2017 CLINICAL DATA:  Mid upper abdominal pain beginning on Friday. Previous diagnosis of ulcer. EXAM: CT ABDOMEN AND PELVIS WITH CONTRAST TECHNIQUE: Multidetector CT imaging of the abdomen and pelvis was performed using the standard protocol following bolus administration of intravenous contrast. CONTRAST:  125mL ISOVUE-300 IOPAMIDOL (ISOVUE-300) INJECTION 61% COMPARISON:  03/09/2009 FINDINGS: Lower chest: Atelectasis in the lung bases. Hepatobiliary: Diffuse fatty infiltration of the liver. No focal liver abnormality is seen. No gallstones, gallbladder wall thickening, or biliary dilatation. Pancreas: There is suggestion of mild infiltration in the peripancreatic fat around the head of the pancreas. This could represent early changes of acute pancreatitis or groove pancreatitis. No pancreatic ductal dilatation. Pancreatic parenchymal enhancement is homogeneous. No peripancreatic fluid collections. Spleen: Normal in size without focal abnormality. Adrenals/Urinary Tract: Adrenal glands are unremarkable. Punctate stones demonstrated in the left kidney. No hydronephrosis or hydroureter. Nephrograms are symmetrical. Bladder is unremarkable. Stomach/Bowel: Stomach is within normal limits. Appendix appears normal. No evidence of bowel wall thickening, distention, or inflammatory changes. Vascular/Lymphatic: No significant vascular findings are present. No enlarged abdominal or pelvic lymph nodes. Reproductive: Prostate is unremarkable. Other: No abdominal wall hernia or abnormality. No  abdominopelvic ascites. Musculoskeletal: No acute or significant osseous findings. IMPRESSION: 1. Mild infiltration in the fat around the head of the pancreas suggesting early focal pancreatitis. No complicating features identified. 2. Diffuse fatty infiltration of the liver. 3. Nonobstructing punctate stones in the left kidney. Electronically Signed   By: Burman NievesWilliam  Stevens M.D.   On: 07/24/2017 05:26   ASSESSMENT AND PLAN:  This is a 46 year old male admitted for pancreatitis.  1.  Acute Pancreatitis: Continue aggressive hydration n.p.o. for now, monitor lipase, start clear liquid tomorrow if pain under control and lipase improving 2.  CAD: Stable; continue aspirin and Plavix 3.  Hypertension: Controlled; continue hydrochlorothiazide and Coreg if blood pressure tolerates 4.  Hyperlipidemia: continue statin therapy       All the records are reviewed and case discussed with Care Management/Social Worker. Management plans discussed with the patient, nursing and they are in agreement.  CODE STATUS: Full Code  TOTAL TIME TAKING CARE OF THIS PATIENT: 35 minutes.   More than 50% of the time was spent in counseling/coordination of care: YES  POSSIBLE D/C IN 1-2 DAYS, DEPENDING ON CLINICAL CONDITION.   Delfino LovettVipul Yulisa Chirico M.D on 07/24/2017 at 2:28 PM  Between 7am to 6pm - Pager - [phone]  After 6pm go to [email or web address] - Social research officer, [email or web address] [email or web address]  [facility]  Office  [phone]  CC: Primary care physician; Carolynn ServeHinson, John, MD  Note: This dictation was prepared with Dragon dictation along with smaller phrase technology. Any transcriptional errors that result from this process are unintentional.

## 2017-07-24 NOTE — ED Notes (Signed)
Pt states that he has abdominal pain on right side. Started Friday after eating a spicy chicken sandwich and states that nothing is helping the pain and he cant sleep.

## 2017-07-24 NOTE — ED Notes (Signed)
O2/Cleary/2L/MIN started for SaO2 < 90% on RA

## 2017-07-24 NOTE — ED Provider Notes (Signed)
[facility] Emergency Department Provider Note   First MD Initiated Contact with Patient 07/24/17 [phone]     (approximate)  I have reviewed the triage vital signs and the nursing notes.   HISTORY  Chief Complaint Abdominal Pain    HPI Henry Howe is a 46 y.o. male with below list of chronic medical conditions including pancreatitis presents to the emergency department with upper abdominal pain times 4 days with acute worsening tonight.  Patient states current pain score is 9 out of 10.  Patient denies any vomiting however does admit to nausea.  Patient denies any fever afebrile on presentation temperature 97.9.  Patient denies any diarrhea constipation.  Patient admits to occasional alcohol use with the last EtOH ingestion approximately 1 month ago.   Past Medical History:  Diagnosis Date  . Arthritis   . Asthma   . CAD in native artery 07/29/2013   Cath 07/29/13: Occluded OM, 80% posterior lateral branch, mild luminal irregularities of LAD, normal EF-medical management  . CHF (congestive heart failure) (HCC)   . Hyperlipidemia   . Myocardial infarction (HCC)   . Peptic ulcer 2011    Patient Active Problem List   Diagnosis Date Noted  . Pancreatitis 07/24/2017  . 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 HTN (hypertension) 07/30/2013  . CAD in native artery 07/29/2013  . Hyperlipidemia     Past Surgical History:  Procedure Laterality Date  . ESOPHAGOGASTRODUODENOSCOPY     Hx ulcers  . KNEE SURGERY Bilateral    1 left, 2 right  . LEFT HEART CATHETERIZATION WITH CORONARY ANGIOGRAM N/A 07/29/2013   Performed by Jake Bathe, MD at [facility] CATH LAB    Prior to Admission medications   Medication Sig Start Date End Date Taking? Authorizing Provider  albuterol (ACCUNEB) 0.63 MG/3ML nebulizer solution Take 1 ampule by nebulization every 6 (six) hours as needed for wheezing or shortness of breath.   Yes [provider]  aspirin EC 81 MG EC tablet Take 1  tablet (81 mg total) by mouth daily. 07/30/13  Yes Quintella Reichert, MD  atorvastatin (LIPITOR) 40 MG tablet Take 40 mg by mouth every Monday, Wednesday, and Friday.   Yes [provider]  carvedilol (COREG) 3.125 MG tablet Take 1 tablet (3.125 mg total) by mouth 2 (two) times daily. 07/18/16  Yes Turner, Cornelious Bryant, MD  clopidogrel (PLAVIX) 75 MG tablet TAKE 1 TABLET (75 MG TOTAL) BY MOUTH DAILY WITH BREAKFAST. 12/27/16  Yes Turner, Traci R, MD  hydrochlorothiazide (HYDRODIURIL) 25 MG tablet TAKE 1 TABLET (25 MG TOTAL) BY MOUTH DAILY. 11/13/16  Yes Turner, Cornelious Bryant, MD  nitroGLYCERIN (NITROSTAT) 0.4 MG SL tablet Place 0.4 mg every 5 (five) minutes as needed under the tongue for chest pain.   Yes [provider]  potassium chloride SA (K-DUR,KLOR-CON) 20 MEQ tablet Take 1 tablet (20 mEq total) by mouth daily. 07/26/15  Yes Quintella Reichert, MD    Allergies Erythromycin; Hydrocodone; Erythromycin base; Erythromycin ethylsuccinate; and Hydrocodone-acetaminophen  Family History  Problem Relation Age of Onset  . Diabetes Father   . Heart disease Father   . Lung cancer Father   . Hypertension Mother   . Diabetes Mother   . Cancer Mother        type unknown, Mets  . Heart disease Paternal Uncle   . Cancer Paternal Uncle        x 2, type uknown    Social History Social History   Tobacco  Use  . Smoking status: Former Smoker    Packs/day: 1.00    Last attempt to quit: 11/13/1989    Years since quitting: 27.7  . Smokeless tobacco: Never Used  Substance Use Topics  . Alcohol use: Yes    Comment: occ single drink  . Drug use: No    Review of Systems Constitutional: No fever/chills Eyes: No visual changes. ENT: No sore throat. Cardiovascular: Denies chest pain. Respiratory: Denies shortness of breath. Gastrointestinal: Positive for abdominal pain and nausea no vomiting.  No diarrhea.  No constipation. Genitourinary: Negative for dysuria. Musculoskeletal: Negative for neck  pain.  Negative for back pain. Integumentary: Negative for rash. Neurological: Negative for headaches, focal weakness or numbness.   ____________________________________________   PHYSICAL EXAM:  VITAL SIGNS: ED Triage Vitals  Enc Vitals Group     BP 07/23/17 2323 130/90     Pulse Rate 07/23/17 2323 98     Resp 07/23/17 2323 17     Temp 07/23/17 2323 97.9 F (36.6 C)     Temp Source 07/23/17 2323 Oral     SpO2 07/23/17 2323 97 %     Weight 07/23/17 2324 111.1 kg (245 lb)     Height 07/24/17 0049 1.778 m (5\' 10" )     Head Circumference --      Peak Flow --      Pain Score 07/23/17 2323 8     Pain Loc --      Pain Edu? --      Excl. in GC? --     Constitutional: Alert and oriented.  Apparent discomfort Eyes: Conjunctivae are normal.  Head: Atraumatic. Mouth/Throat: Mucous membranes are moist. Neck: No stridor.   Cardiovascular: Normal rate, regular rhythm. Good peripheral circulation. Grossly normal heart sounds. Respiratory: Normal respiratory effort.  No retractions. Lungs CTAB. Gastrointestinal: Epigastric tenderness to palpation.. No distention.  Musculoskeletal: No lower extremity tenderness nor edema. No gross deformities of extremities. Neurologic:  Normal speech and language. No gross focal neurologic deficits are appreciated.  Skin:  Skin is warm, dry and intact. No rash noted. Psychiatric: Mood and affect are normal. Speech and behavior are normal.  ____________________________________________   LABS (all labs ordered are listed, but only abnormal results are displayed)  Labs Reviewed  LIPASE, BLOOD - Abnormal; Notable for the following components:      Result Value   Lipase 329 (*)    All other components within normal limits  COMPREHENSIVE METABOLIC PANEL - Abnormal; Notable for the following components:   Glucose, Bld 104 (*)    Calcium 8.8 (*)    Total Bilirubin 1.3 (*)    All other components within normal limits  CBC - Abnormal; Notable for the  following components:   WBC 16.1 (*)    RBC 4.24 (*)    All other components within normal limits  URINALYSIS, COMPLETE (UACMP) WITH MICROSCOPIC - Abnormal; Notable for the following components:   Color, Urine YELLOW (*)    APPearance CLEAR (*)    Hgb urine dipstick SMALL (*)    Protein, ur 30 (*)    All other components within normal limits  TROPONIN I   ____________________________________________  EKG  Time: 11:24 PM Rate: 93 Rhythm: Normal sinus rhythm Axis: Normal Interval: Normal ST segment and T wave: No ST segment abnormalities.  T wave inversion lead 2 ____________________________________________  RADIOLOGY I, Refugio N Lilie Vezina, personally viewed and evaluated these images (plain radiographs) as part of my medical decision making, as well as reviewing  the written report by the radiologist.  US Abdomen Complete  Result Date: 07/24/2017 CLINICAL DATA:  46 year old male with right upper quadrant abdominal pain and nausea. EXAM: ABDOMEN ULTRASOUND COMPLETE COMPARISON:  Abdominal CT dated 03/09/2009, ultrasound dated 03/15/2009, and HIDA scan dated 03/19/2009 FINDINGS: Gallbladder: No gallstones or wall thickening visualized. No sonographic Murphy sign noted by sonographer. Common bile duct: Diameter: 4 mm Liver: There is diffuse fatty infiltration of the liver. Superimposed inflammation or fibrosis is not excluded. Clinical correlation is recommended. The visualized portion of the main portal vein appear patent. Hepatopetal flow. IVC: Not visualized and obscured by bowel gas. Pancreas: Not visualized and obscured by bowel gas. Spleen: Grossly unremarkable as visualized. Right Kidney: Length: 11.2 cm.  No hydronephrosis. Left Kidney: Length: 11.6 cm. No hydronephrosis. Evaluation of the kidneys are limited due to suboptimal visualization. Abdominal aorta: The aorta is only partially visualized with limited evaluation due to overlying bowel gas. The aorta measures up to 2.5 cm  proximally. Other findings: None. IMPRESSION: 1. Fatty liver. 2. Unremarkable gallbladder. 3. 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 4. Patent main portal vein with hepatopetal flow. Electronically Signed   By: Elgie Collard M.D.   On: 07/24/2017 03:24   Ct Abdomen Pelvis W Contrast  Result Date: 07/24/2017 CLINICAL DATA:  Mid upper abdominal pain beginning on Friday. Previous diagnosis of ulcer. EXAM: CT ABDOMEN AND PELVIS WITH CONTRAST TECHNIQUE: Multidetector CT imaging of the abdomen and pelvis was performed using the standard protocol following bolus administration of intravenous contrast. CONTRAST:  ISOVUE-300 IOPAMIDOL (ISOVUE-300) INJECTION 61% COMPARISON:  03/09/2009 FINDINGS: Lower chest: Atelectasis in the lung bases. Hepatobiliary: Diffuse fatty infiltration of the liver. No focal liver abnormality is seen. No gallstones, gallbladder wall thickening, or biliary dilatation. Pancreas: There is suggestion of mild infiltration in the peripancreatic fat around the head of the pancreas. This could represent early changes of acute pancreatitis or groove pancreatitis. No pancreatic ductal dilatation. Pancreatic parenchymal enhancement is homogeneous. No peripancreatic fluid collections. Spleen: Normal in size without focal abnormality. Adrenals/Urinary Tract: Adrenal glands are unremarkable. Punctate stones demonstrated in the left kidney. No hydronephrosis or hydroureter. Nephrograms are symmetrical. Bladder is unremarkable. Stomach/Bowel: Stomach is within normal limits. Appendix appears normal. No evidence of bowel wall thickening, distention, or inflammatory changes. Vascular/Lymphatic: No significant vascular findings are present. No enlarged abdominal or pelvic lymph nodes. Reproductive: Prostate is unremarkable. Other: No abdominal wall hernia or abnormality. No abdominopelvic ascites. Musculoskeletal: No acute or significant osseous findings. IMPRESSION: 1. Mild infiltration in the fat around  the head of the pancreas suggesting early focal pancreatitis. No complicating features identified. 2. Diffuse fatty infiltration of the liver. 3. Nonobstructing punctate stones in the left kidney. Electronically Signed   By: Burman Nieves M.D.   On: 07/24/2017 05:26      Procedures   ____________________________________________   INITIAL IMPRESSION / ASSESSMENT AND PLAN / ED COURSE  As part of my medical decision making, I reviewed the following data within the electronic MEDICAL RECORD [id number] year old male presented with above-stated history and physical exam consistent with acute pancreatitis.  Laboratory data revealed elevated lipase of 329.  Ultrasound revealed no acute abnormality CT scan of the abdomen consistent with acute pancreatitis.  Patient received multiple doses of IV morphine in the emergency department with pain patient current pain score 7 out of 10.  Patient also given multiple doses of antiemetic in the emergency department but continues to have vomiting.  As such patient admitted to Dr. Sheryle Hail hospitalist  for further evaluation and management ____________________________________________  FINAL CLINICAL IMPRESSION(S) / ED DIAGNOSES  Final diagnoses:  Acute pancreatitis, unspecified complication status, unspecified pancreatitis type     MEDICATIONS GIVEN DURING THIS VISIT:  Medications  ondansetron (ZOFRAN) injection 4 mg (4 mg Intravenous Given 07/24/17 0116)  morphine 2 MG/ML injection 2 mg (2 mg Intravenous Given 07/24/17 0116)  morphine 4 MG/ML injection 4 mg (4 mg Intravenous Given 07/24/17 0319)  iopamidol (ISOVUE-300) 61 % injection 125 mL (125 mLs Intravenous Contrast Given 07/24/17 0508)  ondansetron (ZOFRAN) injection 4 mg (4 mg Intravenous Given 07/24/17 0453)  HYDROmorphone (DILAUDID) injection 1 mg (1 mg Intravenous Given 07/24/17 0540)  sodium chloride 0.9 % bolus 1,000 mL (1,000 mLs Intravenous New Bag/Given 07/24/17 0540)     ED Discharge Orders     None       Note:  This document was prepared using Dragon voice recognition software and may include unintentional dictation errors.    Darci CurrentBrown, [place], MD 07/24/17 [phone]

## 2017-07-24 NOTE — H&P (Signed)
Henry Howe is an 46 y.o. male.   Chief Complaint: Abdominal pain HPI: The patient with past medical history of CAD status post myocardial infarction, CHF, hyperlipidemia and history of pancreatitis presents to the emergency department complaining of abdominal pain.  He denies fever.  The patient states that he has some nausea but did not begin vomiting until arrival to the emergency department.  This is has been nonbloody nonbilious.  Laboratory evaluation revealed lipase greater than 300 as well as an elevated white blood cell count.  The patient was given intravenous fluid as well as antiemetics.  Imaging did not show gallstone.  The patient was unable to tolerate p.o. and continued to have abdominal pain which prompted the emergency department staff to call the hospitalist service for admission.  Past Medical History:  Diagnosis Date  . Arthritis   . Asthma   . CAD in native artery 07/29/2013   Cath 07/29/13: Occluded OM, 80% posterior lateral branch, mild luminal irregularities of LAD, normal EF-medical management  . CHF (congestive heart failure) ([place])   . Hyperlipidemia   . Myocardial infarction ([place])   . Peptic ulcer 2011    Past Surgical History:  Procedure Laterality Date  . ESOPHAGOGASTRODUODENOSCOPY     Hx ulcers  . KNEE SURGERY Bilateral    1 left, 2 right  . LEFT HEART CATHETERIZATION WITH CORONARY ANGIOGRAM N/A 07/29/2013   Performed by Jerline Pain, MD at [facility] CATH LAB    Family History  Problem Relation Age of Onset  . Diabetes Father   . Heart disease Father   . Lung cancer Father   . Hypertension Mother   . Diabetes Mother   . Cancer Mother        type unknown, Mets  . Heart disease Paternal Uncle   . Cancer Paternal Uncle        x 2, type uknown   Social History:  reports that he quit smoking about 27 years ago. He smoked 1.00 pack per day. he has never used smokeless tobacco. He reports that he drinks alcohol. He reports that he does not use  drugs.  Allergies:  Allergies  Allergen Reactions  . Erythromycin Other (See Comments)    Does not want to take Liver failure  . Hydrocodone Nausea And Vomiting  . Erythromycin Base Other (See Comments)    Unknown  . Erythromycin Ethylsuccinate Other (See Comments)    unknown  . Hydrocodone-Acetaminophen Nausea And Vomiting     (Not in a hospital admission)  Results for orders placed or performed during the hospital encounter of 07/24/17 (from the past 48 hour(s))  Lipase, blood     Status: Abnormal   Collection Time: 07/23/17 11:26 PM  Result Value Ref Range   Lipase 329 (H) 11 - 51 U/L  Comprehensive metabolic panel     Status: Abnormal   Collection Time: 07/23/17 11:26 PM  Result Value Ref Range   Sodium 137 135 - 145 mmol/L   Potassium 3.5 3.5 - 5.1 mmol/L   Chloride 104 101 - 111 mmol/L   CO2 24 22 - 32 mmol/L   Glucose, Bld 104 (H) 65 - 99 mg/dL   BUN 15 6 - 20 mg/dL   Creatinine, Ser 0.97 0.61 - 1.24 mg/dL   Calcium 8.8 (L) 8.9 - 10.3 mg/dL   Total Protein 7.5 6.5 - 8.1 g/dL   Albumin 3.6 3.5 - 5.0 g/dL   AST 35 15 - 41 U/L   ALT 43 17 -  63 U/L   Alkaline Phosphatase 86 38 - 126 U/L   Total Bilirubin 1.3 (H) 0.3 - 1.2 mg/dL   GFR calc non Af Amer >60 >60 mL/min   GFR calc Af Amer >60 >60 mL/min    Comment: (NOTE) The eGFR has been calculated using the CKD EPI equation. This calculation has not been validated in all clinical situations. eGFR's persistently <60 mL/min signify possible Chronic Kidney Disease.    Anion gap 9 5 - 15  CBC     Status: Abnormal   Collection Time: 07/23/17 11:26 PM  Result Value Ref Range   WBC 16.1 (H) 3.8 - 10.6 K/uL   RBC 4.24 (L) 4.40 - 5.90 MIL/uL   Hemoglobin 14.3 13.0 - 18.0 g/dL   HCT 41.1 40.0 - 52.0 %   MCV 96.9 80.0 - 100.0 fL   MCH 33.8 26.0 - 34.0 pg   MCHC 34.9 32.0 - 36.0 g/dL   RDW 13.2 11.5 - 14.5 %   Platelets 245 150 - 440 K/uL  Urinalysis, Complete w Microscopic     Status: Abnormal   Collection Time:  07/23/17 11:26 PM  Result Value Ref Range   Color, Urine YELLOW (A) YELLOW   APPearance CLEAR (A) CLEAR   Specific Gravity, Urine 1.025 1.005 - 1.030   pH 6.0 5.0 - 8.0   Glucose, UA NEGATIVE NEGATIVE mg/dL   Hgb urine dipstick SMALL (A) NEGATIVE   Bilirubin Urine NEGATIVE NEGATIVE   Ketones, ur NEGATIVE NEGATIVE mg/dL   Protein, ur 30 (A) NEGATIVE mg/dL   Nitrite NEGATIVE NEGATIVE   Leukocytes, UA NEGATIVE NEGATIVE   RBC / HPF 0-5 0 - 5 RBC/hpf   WBC, UA 0-5 0 - 5 WBC/hpf   Bacteria, UA NONE SEEN NONE SEEN   Squamous Epithelial / LPF NONE SEEN NONE SEEN   Mucus PRESENT   Troponin I     Status: None   Collection Time: 07/23/17 11:26 PM  Result Value Ref Range   Troponin I <0.03 <0.03 ng/mL   US Abdomen Complete  Result Date: 07/24/2017 CLINICAL DATA:  46 year old male with right upper quadrant abdominal pain and nausea. EXAM: ABDOMEN ULTRASOUND COMPLETE COMPARISON:  Abdominal CT dated 03/09/2009, ultrasound dated 03/15/2009, and HIDA scan dated 03/19/2009 FINDINGS: Gallbladder: No gallstones or wall thickening visualized. No sonographic Murphy sign noted by sonographer. Common bile duct: Diameter: 4 mm Liver: There is diffuse fatty infiltration of the liver. Superimposed inflammation or fibrosis is not excluded. Clinical correlation is recommended. The visualized portion of the main portal vein appear patent. Hepatopetal flow. IVC: Not visualized and obscured by bowel gas. Pancreas: Not visualized and obscured by bowel gas. Spleen: Grossly unremarkable as visualized. Right Kidney: Length: 11.2 cm.  No hydronephrosis. Left Kidney: Length: 11.6 cm. No hydronephrosis. Evaluation of the kidneys are limited due to suboptimal visualization. Abdominal aorta: The aorta is only partially visualized with limited evaluation due to overlying bowel gas. The aorta measures up to 2.5 cm proximally. Other findings: None. IMPRESSION: 1. Fatty liver. 2. Unremarkable gallbladder. 3. Ectatic aorta measures up  to 2.5 cm. 4. Patent main portal vein with hepatopetal flow. Electronically Signed   By: Anner Crete M.D.   On: 07/24/2017 03:24   Ct Abdomen Pelvis W Contrast  Result Date: 07/24/2017 CLINICAL DATA:  Mid upper abdominal pain beginning on Friday. Previous diagnosis of ulcer. EXAM: CT ABDOMEN AND PELVIS WITH CONTRAST TECHNIQUE: Multidetector CT imaging of the abdomen and pelvis was performed using the standard protocol following bolus  administration of intravenous contrast. CONTRAST:  169m ISOVUE-300 IOPAMIDOL (ISOVUE-300) INJECTION 61% COMPARISON:  03/09/2009 FINDINGS: Lower chest: Atelectasis in the lung bases. Hepatobiliary: Diffuse fatty infiltration of the liver. No focal liver abnormality is seen. No gallstones, gallbladder wall thickening, or biliary dilatation. Pancreas: There is suggestion of mild infiltration in the peripancreatic fat around the head of the pancreas. This could represent early changes of acute pancreatitis or groove pancreatitis. No pancreatic ductal dilatation. Pancreatic parenchymal enhancement is homogeneous. No peripancreatic fluid collections. Spleen: Normal in size without focal abnormality. Adrenals/Urinary Tract: Adrenal glands are unremarkable. Punctate stones demonstrated in the left kidney. No hydronephrosis or hydroureter. Nephrograms are symmetrical. Bladder is unremarkable. Stomach/Bowel: Stomach is within normal limits. Appendix appears normal. No evidence of bowel wall thickening, distention, or inflammatory changes. Vascular/Lymphatic: No significant vascular findings are present. No enlarged abdominal or pelvic lymph nodes. Reproductive: Prostate is unremarkable. Other: No abdominal wall hernia or abnormality. No abdominopelvic ascites. Musculoskeletal: No acute or significant osseous findings. IMPRESSION: 1. Mild infiltration in the fat around the head of the pancreas suggesting early focal pancreatitis. No complicating features identified. 2. Diffuse fatty  infiltration of the liver. 3. Nonobstructing punctate stones in the left kidney. Electronically Signed   By: WLucienne CapersM.D.   On: 07/24/2017 05:26    Review of Systems  Constitutional: Negative for chills and fever.  HENT: Negative for sore throat and tinnitus.   Eyes: Negative for blurred vision and redness.  Respiratory: Negative for cough and shortness of breath.   Cardiovascular: Negative for chest pain, palpitations, orthopnea and PND.  Gastrointestinal: Positive for abdominal pain, nausea and vomiting. Negative for diarrhea.  Genitourinary: Negative for dysuria, frequency and urgency.  Musculoskeletal: Negative for joint pain and myalgias.  Skin: Negative for rash.       No lesions  Neurological: Negative for speech change, focal weakness and weakness.  Endo/Heme/Allergies: Does not bruise/bleed easily.       No temperature intolerance  Psychiatric/Behavioral: Negative for depression and suicidal ideas.    Blood pressure 96/71, pulse 90, temperature 97.9 F (36.6 C), temperature source Oral, resp. rate 18, height '5\' 10"'$  (1.778 m), weight 108.9 kg (240 lb), SpO2 95 %. Physical Exam  Vitals reviewed. Constitutional: He is oriented to person, place, and time. He appears well-developed and well-nourished. No distress.  HENT:  Head: Normocephalic and atraumatic.  Mouth/Throat: Oropharynx is clear and moist.  Eyes: Conjunctivae and EOM are normal. Pupils are equal, round, and reactive to light. No scleral icterus.  Neck: Normal range of motion. Neck supple. No JVD present. No tracheal deviation present. No thyromegaly present.  Cardiovascular: Normal rate, regular rhythm and normal heart sounds. Exam reveals no gallop and no friction rub.  No murmur heard. Respiratory: Effort normal and breath sounds normal. No respiratory distress.  GI: Soft. Bowel sounds are normal. He exhibits no distension. There is no tenderness.  Genitourinary:  Genitourinary Comments: Deferred   Musculoskeletal: Normal range of motion. He exhibits no edema.  Lymphadenopathy:    He has no cervical adenopathy.  Neurological: He is alert and oriented to person, place, and time. No cranial nerve deficit.  Skin: Skin is warm and dry. No rash noted. No erythema.  Psychiatric: He has a normal mood and affect. His behavior is normal. Judgment and thought content normal.     Assessment/Plan This is a 46year old male admitted for pancreatitis. 1.  Pancreatitis: Unable to tolerate p.o. intake. Hydrate aggressively with intravenous saline.  [place] diet as tolerated. 2.  CAD: Stable; continue aspirin and Plavix 3.  Hypertension: Controlled; continue hydrochlorothiazide and Coreg if blood pressure tolerates 4.  Hyperlipidemia: Check lipids in the morning; continue statin therapy 5.  DVT prophylaxis: Lovenox 6.  GI prophylaxis: Pantoprazole for symptomatic relief The patient is a full code.  Time spent on admission orders and inpatient care approximately 45 minutes  Harrie Foreman, MD 07/24/2017, 6:04 AM

## 2017-07-24 NOTE — ED Notes (Signed)
Patient transported to Ultrasound 

## 2017-07-25 LAB — COMPREHENSIVE METABOLIC PANEL
ALK PHOS: 71 U/L (ref 38–126)
ALT: 32 U/L (ref 17–63)
AST: 25 U/L (ref 15–41)
Albumin: 3.3 g/dL — ABNORMAL LOW (ref 3.5–5.0)
Anion gap: 6 (ref 5–15)
BUN: 9 mg/dL (ref 6–20)
CALCIUM: 8.4 mg/dL — AB (ref 8.9–10.3)
CO2: 20 mmol/L — AB (ref 22–32)
CREATININE: 0.81 mg/dL (ref 0.61–1.24)
Chloride: 114 mmol/L — ABNORMAL HIGH (ref 101–111)
GFR calc non Af Amer: >60 mL/min (ref >60)
Glucose, Bld: 110 mg/dL — ABNORMAL HIGH (ref 65–99)
Potassium: 3.6 mmol/L (ref 3.5–5.1)
SODIUM: 140 mmol/L (ref 135–145)
Total Bilirubin: 1.3 mg/dL — ABNORMAL HIGH (ref 0.3–1.2)
Total Protein: 7.1 g/dL (ref 6.5–8.1)

## 2017-07-25 LAB — CBC
HCT: 38.3 % — ABNORMAL LOW (ref 40.0–52.0)
HEMOGLOBIN: 13.1 g/dL (ref 13.0–18.0)
MCH: 33.9 pg (ref 26.0–34.0)
MCHC: 34.2 g/dL (ref 32.0–36.0)
MCV: 99.2 fL (ref 80.0–100.0)
PLATELETS: 208 10*3/uL (ref 150–440)
RBC: 3.86 MIL/uL — ABNORMAL LOW (ref 4.40–5.90)
RDW: 13.5 % (ref 11.5–14.5)
WBC: 15 10*3/uL — ABNORMAL HIGH (ref 3.8–10.6)

## 2017-07-25 LAB — LIPASE, BLOOD: Lipase: 105 U/L — ABNORMAL HIGH (ref 11–51)

## 2017-07-25 MED ORDER — ZOLPIDEM TARTRATE 5 MG PO TABS
5.0000 mg | ORAL_TABLET | Freq: Every evening | ORAL | Status: DC | PRN
  Filled 2017-07-25: qty 1

## 2017-07-25 NOTE — Progress Notes (Signed)
Patient requesting sleeping pill for tonight. MD notified. Orders pending.

## 2017-07-25 NOTE — Progress Notes (Signed)
1        [facility] - [place] at [facility]   PATIENT NAME: Henry Howe    MR#:  [id number]  DATE OF BIRTH:  05/16/1971  SUBJECTIVE:  CHIEF COMPLAINT:   Chief Complaint  Patient presents with  . Abdominal Pain   pain is somewhat better, slowly improving, wants to eat REVIEW OF SYSTEMS:  Review of Systems  Constitutional: Negative for chills, fever and weight loss.  HENT: Negative for nosebleeds and sore throat.   Eyes: Negative for blurred vision.  Respiratory: Negative for cough, shortness of breath and wheezing.   Cardiovascular: Negative for chest pain, orthopnea, leg swelling and PND.  Gastrointestinal: Positive for abdominal pain. Negative for constipation, diarrhea, heartburn, nausea and vomiting.  Genitourinary: Negative for dysuria and urgency.  Musculoskeletal: Negative for back pain.  Skin: Negative for rash.  Neurological: Negative for dizziness, speech change, focal weakness and headaches.  Endo/Heme/Allergies: Does not bruise/bleed easily.  Psychiatric/Behavioral: Negative for depression.    DRUG ALLERGIES:   Allergies  Allergen Reactions  . Erythromycin Other (See Comments)    Does not want to take Liver failure  . Hydrocodone Nausea And Vomiting  . Erythromycin Base Other (See Comments)    Unknown  . Erythromycin Ethylsuccinate Other (See Comments)    unknown  . Hydrocodone-Acetaminophen Nausea And Vomiting   VITALS:  Blood pressure 120/76, pulse 74, temperature 99.2 F (37.3 C), temperature source Oral, resp. rate 20, height 5\' 10"  (1.778 m), weight 109.3 kg (241 lb), SpO2 97 %. PHYSICAL EXAMINATION:  Physical Exam  Constitutional: He is oriented to person, place, and time and well-developed, well-nourished, and in no distress.  HENT:  Head: Normocephalic and atraumatic.  Eyes: Conjunctivae and EOM are normal. Pupils are equal, round, and reactive to light.  Neck: Normal range of motion. Neck supple. No tracheal deviation  present. No thyromegaly present.  Cardiovascular: Normal rate, regular rhythm and normal heart sounds.  Pulmonary/Chest: Effort normal and breath sounds normal. No respiratory distress. He has no wheezes. He exhibits no tenderness.  Abdominal: Soft. Bowel sounds are normal. He exhibits no distension. There is no tenderness.  Musculoskeletal: Normal range of motion.  Neurological: He is alert and oriented to person, place, and time. No cranial nerve deficit.  Skin: Skin is warm and dry. No rash noted.  Psychiatric: Mood and affect normal.   LABORATORY PANEL:  Male CBC Recent Labs  Lab 07/25/17 0430  WBC 15.0*  HGB 13.1  HCT 38.3*  PLT 208   ------------------------------------------------------------------------------------------------------------------ Chemistries  Recent Labs  Lab 07/25/17 0430  NA 140  K 3.6  CL 114*  CO2 20*  GLUCOSE 110*  BUN 9  CREATININE 0.81  CALCIUM 8.4*  AST 25  ALT 32  ALKPHOS 71  BILITOT 1.3*   RADIOLOGY:  No results found. ASSESSMENT AND PLAN:  This is a 46 year old male admitted for pancreatitis.  1.  Acute Pancreatitis: start CLD,, monitor lipase (329->105), advance to full liquid later today and soft diet may be at dinner. if pain under control and lipase continues to improve, can plan on D/C in am 2.  CAD: Stable; continue aspirin and Plavix 3.  Hypertension: Controlled; continue hydrochlorothiazide and Coreg if blood pressure tolerates 4.  Hyperlipidemia: continue statin therapy       All the records are reviewed and case discussed with Care Management/Social Worker. Management plans discussed with the patient, nursing and they are in agreement.  CODE STATUS: Full Code  TOTAL TIME TAKING CARE  OF THIS PATIENT: 35 minutes.   More than 50% of the time was spent in counseling/coordination of care: YES  POSSIBLE D/C IN 1 DAYS, DEPENDING ON CLINICAL CONDITION.   Delfino LovettVipul Kahlie Deutscher M.D on 07/25/2017 at 2:22 PM  Between 7am to 6pm -  Pager - [phone]  After 6pm go to [email or web address] - Social research officer, [email or web address] [email or web address]  [facility]  Office  [phone]  CC: Primary care physician; Carolynn ServeHinson, John, MD  Note: This dictation was prepared with Dragon dictation along with smaller phrase technology. Any transcriptional errors that result from this process are unintentional.

## 2017-07-26 LAB — LIPASE, BLOOD: LIPASE: 73 U/L — AB (ref 11–51)

## 2017-07-26 NOTE — Discharge Summary (Signed)
[facility] - [place] at [facility]   PATIENT NAME: Henry Howe    MR#:  [id number]  DATE OF BIRTH:  [birth year]  DATE OF ADMISSION:  07/24/2017 ADMITTING PHYSICIAN: Arnaldo NatalMichael S Diamond, MD  DATE OF DISCHARGE: 07/26/2017   PRIMARY CARE PHYSICIAN: Carolynn ServeHinson, John, MD    ADMISSION DIAGNOSIS:  Acute pancreatitis, unspecified complication status, unspecified pancreatitis type [K85.90]  DISCHARGE DIAGNOSIS:  Active Problems:   Pancreatitis   SECONDARY DIAGNOSIS:   Past Medical History:  Diagnosis Date  . Arthritis   . Asthma   . CAD in native artery 07/29/2013   Cath 07/29/13: Occluded OM, 80% posterior lateral branch, mild luminal irregularities of LAD, normal EF-medical management  . CHF (congestive heart failure) (HCC)   . Hyperlipidemia   . Myocardial infarction (HCC)   . Peptic ulcer 2011    HOSPITAL COURSE:   1.  AcutePancreatitis: start CLD,, monitor lipase (329->105), advance to full liquid later today and soft diet tolerated. No pain, lipase improved, d./c home today. 2. CAD: Stable; continue aspirin and Plavix 3. Hypertension: Controlled; continue hydrochlorothiazide and Coreg if blood pressure tolerates 4. Hyperlipidemia: continue statin therapy   DISCHARGE CONDITIONS:   Stable.  CONSULTS OBTAINED:    DRUG ALLERGIES:   Allergies  Allergen Reactions  . Erythromycin Other (See Comments)    Does not want to take Liver failure  . Hydrocodone Nausea And Vomiting  . Erythromycin Base Other (See Comments)    Unknown  . Erythromycin Ethylsuccinate Other (See Comments)    unknown  . Hydrocodone-Acetaminophen Nausea And Vomiting    DISCHARGE MEDICATIONS:   Current Discharge Medication List    CONTINUE these medications which have NOT CHANGED   Details  albuterol (ACCUNEB) 0.63 MG/3ML nebulizer solution Take 1 ampule by nebulization every 6 (six) hours as needed for wheezing or shortness of breath.    aspirin EC 81 MG  EC tablet Take 1 tablet (81 mg total) by mouth daily.    atorvastatin (LIPITOR) 40 MG tablet Take 40 mg by mouth every Monday, Wednesday, and Friday.    carvedilol (COREG) 3.125 MG tablet Take 1 tablet (3.125 mg total) by mouth 2 (two) times daily. Qty: 60 tablet, Refills: 4    clopidogrel (PLAVIX) 75 MG tablet TAKE 1 TABLET (75 MG TOTAL) BY MOUTH DAILY WITH BREAKFAST. Qty: 30 tablet, Refills: 8    hydrochlorothiazide (HYDRODIURIL) 25 MG tablet TAKE 1 TABLET (25 MG TOTAL) BY MOUTH DAILY. Qty: 30 tablet, Refills: 9    nitroGLYCERIN (NITROSTAT) 0.4 MG SL tablet Place 0.4 mg every 5 (five) minutes as needed under the tongue for chest pain.    potassium chloride SA (K-DUR,KLOR-CON) 20 MEQ tablet Take 1 tablet (20 mEq total) by mouth daily. Qty: 30 tablet, Refills: 11         DISCHARGE INSTRUCTIONS:    Follow with PMD in 1-2 weeks.  If you experience worsening of your admission symptoms, develop shortness of breath, life threatening emergency, suicidal or homicidal thoughts you must seek medical attention immediately by calling 911 or calling your MD immediately  if symptoms less severe.  You Must read complete instructions/literature along with all the possible adverse reactions/side effects for all the Medicines you take and that have been prescribed to you. Take any new Medicines after you have completely understood and accept all the possible adverse reactions/side effects.   Please note  You were cared for by a hospitalist during your hospital stay. If you have any questions about your discharge  medications or the care you received while you were in the hospital after you are discharged, you can call the unit and asked to speak with the hospitalist on call if the hospitalist that took care of you is not available. Once you are discharged, your primary care physician will handle any further medical issues. Please note that NO REFILLS for any discharge medications will be authorized  once you are discharged, as it is imperative that you return to your primary care physician (or establish a relationship with a primary care physician if you do not have one) for your aftercare needs so that they can reassess your need for medications and monitor your lab values.    Today   CHIEF COMPLAINT:   Chief Complaint  Patient presents with  . Abdominal Pain    HISTORY OF PRESENT ILLNESS:  Henry Howe  is a 46 y.o. male with a known history of CAD status post myocardial infarction, CHF, hyperlipidemia and history of pancreatitis presents to the emergency department complaining of abdominal pain.  He denies fever.  The patient states that he has some nausea but did not begin vomiting until arrival to the emergency department.  This is has been nonbloody nonbilious.  Laboratory evaluation revealed lipase greater than 300 as well as an elevated white blood cell count.  The patient was given intravenous fluid as well as antiemetics.  Imaging did not show gallstone.  The patient was unable to tolerate p.o. and continued to have abdominal pain which prompted the emergency department staff to call the hospitalist service for admission.    VITAL SIGNS:  Blood pressure (!) 128/93, pulse 86, temperature 98 F (36.7 C), temperature source Oral, resp. rate 20, height 5\' 10"  (1.778 m), weight 107.4 kg (236 lb 12.8 oz), SpO2 99 %.  I/O:    Intake/Output Summary (Last 24 hours) at 07/26/2017 1035 Last data filed at 07/25/2017 1900 Gross per 24 hour  Intake 600 ml  Output 1000 ml  Net -400 ml    PHYSICAL EXAMINATION:  GENERAL:  46 y.o.-year-old patient lying in the bed with no acute distress.  EYES: Pupils equal, round, reactive to light and accommodation. No scleral icterus. Extraocular muscles intact.  HEENT: Head atraumatic, normocephalic. Oropharynx and nasopharynx clear.  NECK:  Supple, no jugular venous distention. No thyroid enlargement, no tenderness.  LUNGS: Normal breath  sounds bilaterally, no wheezing, rales,rhonchi or crepitation. No use of accessory muscles of respiration.  CARDIOVASCULAR: S1, S2 normal. No murmurs, rubs, or gallops.  ABDOMEN: Soft, non-tender, non-distended. Bowel sounds present. No organomegaly or mass.  EXTREMITIES: No pedal edema, cyanosis, or clubbing.  NEUROLOGIC: Cranial nerves II through XII are intact. Muscle strength 5/5 in all extremities. Sensation intact. Gait not checked.  PSYCHIATRIC: The patient is alert and oriented x 3.  SKIN: No obvious rash, lesion, or ulcer.   DATA REVIEW:   CBC Recent Labs  Lab 07/25/17 0430  WBC 15.0*  HGB 13.1  HCT 38.3*  PLT 208    Chemistries  Recent Labs  Lab 07/25/17 0430  NA 140  K 3.6  CL 114*  CO2 20*  GLUCOSE 110*  BUN 9  CREATININE 0.81  CALCIUM 8.4*  AST 25  ALT 32  ALKPHOS 71  BILITOT 1.3*    Cardiac Enzymes Recent Labs  Lab 07/23/17 2326  TROPONINI <0.03    Microbiology Results  Results for orders placed or performed during the hospital encounter of 07/28/13  MRSA PCR Screening     Status:  None   Collection Time: 07/28/13  6:30 PM  Result Value Ref Range Status   MRSA by PCR NEGATIVE NEGATIVE Final    Comment:        The GeneXpert MRSA Assay (FDA approved for NASAL specimens only), is one component of a comprehensive MRSA colonization surveillance program. It is not intended to diagnose MRSA infection nor to guide or monitor treatment for MRSA infections.    RADIOLOGY:  No results found.  EKG:   Orders placed or performed during the hospital encounter of 07/24/17  . ED EKG  . EKG 12-Lead  . EKG 12-Lead  . ED EKG      Management plans discussed with the patient, family and they are in agreement.  CODE STATUS:     Code Status Orders  (From admission, onward)        Start     Ordered   07/24/17 0711  Full code  Continuous     07/24/17 0710    Code Status History    Date Active Date Inactive Code Status Order ID Comments  User Context   07/28/2013 18:30 07/30/2013 17:18 Full Code [id number]  Barrett, Joline Salthonda G, PA-C Inpatient      TOTAL TIME TAKING CARE OF THIS PATIENT: 35 minutes.    Altamese DillingVaibhavkumar Royanne Warshaw M.D on 07/26/2017 at 10:35 AM  Between 7am to 6pm - Pager - [phone]  After 6pm go to [email or web address] - password [email or web address]  [facility]  Office  [phone]  CC: Primary care physician; Carolynn ServeHinson, John, MD   Note: This dictation was prepared with Dragon dictation along with smaller phrase technology. Any transcriptional errors that result from this process are unintentional.

## 2017-07-26 NOTE — Progress Notes (Signed)
Discharge instructions and med details reviewed with patient. Patient verbalizes understanding. AVS given to pt. IV removed. Pt escorted out to the car.

## 2017-12-05 ENCOUNTER — Other Ambulatory Visit: Payer: Self-pay | Admitting: Cardiology

## 2017-12-29 ENCOUNTER — Other Ambulatory Visit: Payer: Self-pay | Admitting: Cardiology

## 2018-01-24 ENCOUNTER — Other Ambulatory Visit: Payer: Self-pay | Admitting: Cardiology

## 2018-02-03 ENCOUNTER — Other Ambulatory Visit: Payer: Self-pay | Admitting: Cardiology

## 2018-02-25 ENCOUNTER — Other Ambulatory Visit: Payer: Self-pay | Admitting: Cardiology

## 2018-03-02 ENCOUNTER — Other Ambulatory Visit: Payer: Self-pay | Admitting: Cardiology

## 2018-03-04 ENCOUNTER — Other Ambulatory Visit: Payer: Self-pay | Admitting: Cardiology

## 2018-03-30 ENCOUNTER — Other Ambulatory Visit: Payer: Self-pay | Admitting: Cardiology

## 2021-04-16 ENCOUNTER — Encounter: Payer: Self-pay | Admitting: Emergency Medicine

## 2021-04-16 ENCOUNTER — Ambulatory Visit
Admission: EM | Admit: 2021-04-16 | Discharge: 2021-04-16 | Disposition: A | Discharge status: Home / Self Care | Payer: BC Managed Care – PPO

## 2021-04-16 ENCOUNTER — Other Ambulatory Visit: Payer: Self-pay

## 2021-04-16 DIAGNOSIS — L239 Allergic contact dermatitis, unspecified cause: Secondary | ICD-10-CM | POA: Diagnosis not present

## 2021-04-16 DIAGNOSIS — L500 Allergic urticaria: Secondary | ICD-10-CM | POA: Diagnosis not present

## 2021-04-16 MED ORDER — TRIAMCINOLONE ACETONIDE 0.1 % EX CREA: 1.0000 "application " | TOPICAL_CREAM | Freq: Two times a day (BID) | CUTANEOUS | 0 refills | Status: AC

## 2021-04-16 NOTE — ED Provider Notes (Signed)
[facility]    CSN: [id number] Arrival date & time: 04/16/21  1351      History   Chief Complaint Chief Complaint  Patient presents with   Rash    HPI Henry Howe is a 50 y.o. male.   Patient presents to urgent care for rash that has been present for approximately 1 week.  Patient is concerned that it could be related to starting new amlodipine for blood pressure as rash started around the same time that blood pressure medication was changed.  Rash is itchy and erythematous and is present to right hand.  Denies any changes to soaps, detergents, lotions, foods, etc.  Denies any upper respiratory symptoms or fever.   Rash  Past Medical History:  Diagnosis Date   Arthritis    Asthma    CAD in native artery 07/29/2013   Cath 07/29/13: Occluded OM, 80% posterior lateral branch, mild luminal irregularities of LAD, normal EF-medical management   CHF (congestive heart failure) (HCC)    Hyperlipidemia    Myocardial infarction [facility])    Peptic ulcer 2011    Patient Active Problem List   Diagnosis Date Noted   Pancreatitis 07/24/2017   MI, old 02/13/2014   HTN (hypertension) 07/30/2013   CAD in native artery 07/29/2013   Hyperlipidemia     Past Surgical History:  Procedure Laterality Date   ESOPHAGOGASTRODUODENOSCOPY     Hx ulcers   KNEE SURGERY Bilateral    1 left, 2 right   LEFT HEART CATHETERIZATION WITH CORONARY ANGIOGRAM N/A 07/29/2013   Procedure: LEFT HEART CATHETERIZATION WITH CORONARY ANGIOGRAM;  Surgeon: Donato Schultz, MD;  Location: [facility] CATH LAB;  Service: Cardiovascular;  Laterality: N/A;       Home Medications    Prior to Admission medications   Medication Sig Start Date End Date Taking? Authorizing Provider  amLODipine (NORVASC) 2.5 MG tablet Take 2.5 mg by mouth daily.   Yes [provider]  triamcinolone cream (KENALOG) 0.1 % Apply 1 application topically 2 (two) times daily. Limit use to 1 week 04/16/21  Yes Lance Muss,  FNP  albuterol (ACCUNEB) 0.63 MG/3ML nebulizer solution Take 1 ampule by nebulization every 6 (six) hours as needed for wheezing or shortness of breath.    [provider]  aspirin EC 81 MG EC tablet Take 1 tablet (81 mg total) by mouth daily. 07/30/13   Quintella Reichert, MD  atorvastatin (LIPITOR) 40 MG tablet Take 40 mg by mouth every Monday, Wednesday, and Friday.    [provider]  atorvastatin (LIPITOR) 40 MG tablet TAKE 1 TABLET (40 MG TOTAL) BY MOUTH DAILY AT 6 PM. 02/04/18 01/30/19  Quintella Reichert, MD  carvedilol (COREG) 3.125 MG tablet Take 1 tablet (3.125 mg total) by mouth 2 (two) times daily. Patient overdue for an appt, needs to schedule for further refills 1st attempt 12/31/17   Quintella Reichert, MD  clopidogrel (PLAVIX) 75 MG tablet Take 1 tablet (75 mg total) by mouth daily. 03/04/18   Quintella Reichert, MD  hydrochlorothiazide (HYDRODIURIL) 25 MG tablet Take 1 tablet (25 mg total) by mouth daily. 03/04/18   Quintella Reichert, MD  nitroGLYCERIN (NITROSTAT) 0.4 MG SL tablet Place 0.4 mg every 5 (five) minutes as needed under the tongue for chest pain.    [provider]  potassium chloride SA (K-DUR,KLOR-CON) 20 MEQ tablet Take 1 tablet (20 mEq total) by mouth daily. 07/26/15   Quintella Reichert, MD    Family  History Family History  Problem Relation Age of Onset   Diabetes Father    Heart disease Father    Lung cancer Father    Hypertension Mother    Diabetes Mother    Cancer Mother        type unknown, Mets   Heart disease Paternal Uncle    Cancer Paternal Uncle        x 2, type uknown    Social History Social History   Tobacco Use   Smoking status: Former    Packs/day: 1.00    Types: Cigarettes    Quit date: 11/13/1989    Years since quitting: 31.4   Smokeless tobacco: Never  Substance Use Topics   Alcohol use: Yes    Comment: occ single drink   Drug use: No     Allergies   Erythromycin, Hydrocodone, Erythromycin base, Erythromycin  ethylsuccinate, and Hydrocodone-acetaminophen   Review of Systems Review of Systems  Skin:  Positive for rash.   Per HPI Physical Exam Triage Vital Signs ED Triage Vitals  Enc Vitals Group     BP 04/16/21 1510 (!) 151/105     Pulse Rate 04/16/21 1510 83     Resp 04/16/21 1510 18     Temp 04/16/21 1510 98.1 F (36.7 C)     Temp Source 04/16/21 1510 Oral     SpO2 04/16/21 1510 95 %     Weight --      Height --      Head Circumference --      Peak Flow --      Pain Score 04/16/21 1511 0     Pain Loc --      Pain Edu? --      Excl. in GC? --    No data found.  Updated Vital Signs BP (!) 151/105 (BP Location: Left Arm)   Pulse 83   Temp 98.1 F (36.7 C) (Oral)   Resp 18   SpO2 95%   Visual Acuity Right Eye Distance:   Left Eye Distance:   Bilateral Distance:    Right Eye Near:   Left Eye Near:    Bilateral Near:     Physical Exam Constitutional:      Appearance: Normal appearance.  HENT:     Head: Normocephalic and atraumatic.  Eyes:     Extraocular Movements: Extraocular movements intact.     Conjunctiva/sclera: Conjunctivae normal.  Pulmonary:     Effort: Pulmonary effort is normal.  Skin:    General: Skin is warm and dry.     Findings: Rash present. Rash is urticarial.     Comments: Erythematous diffuse maculopapular rash present to right hand.  Neurovascular intact.  Neurological:     General: No focal deficit present.     Mental Status: He is alert and oriented to person, place, and time. Mental status is at baseline.  Psychiatric:        Mood and Affect: Mood normal.        Behavior: Behavior normal.        Thought Content: Thought content normal.        Judgment: Judgment normal.     UC Treatments / Results  Labs (all labs ordered are listed, but only abnormal results are displayed) Labs Reviewed - No data to display  EKG   Radiology No results found.  Procedures Procedures (including critical care time)  Medications Ordered in  UC Medications - No data to display  Initial Impression / Assessment  and Plan / UC Course  I have reviewed the triage vital signs and the nursing notes.  Pertinent labs & imaging results that were available during my care of the patient were reviewed by me and considered in my medical decision making (see chart for details).     Will treat possible contact dermatitis with triamcinolone cream.  Advised patient to limit use to 1 week.  Patient to follow-up with cardiologist or PCP to determine if amlodipine blood pressure medication needs to be changed if allergic reaction is occurring.  Do not want to stop amlodipine at this time due to elevated blood pressure in urgent care today.  Blood pressure recheck was 152/106.  Patient does not have any signs of hypertensive urgency at this time.  Do not think that emergent medical attention at the hospital is necessary due to blood pressure at this time.  Discussed strict return precautions. Patient verbalized understanding and is agreeable with plan.  Final Clinical Impressions(s) / UC Diagnoses   Final diagnoses:  Allergic contact dermatitis, unspecified trigger  Allergic urticaria     Discharge Instructions      You have been prescribed triamcinolone cream to treat allergic reaction to hand.  Please follow-up with provider who prescribed amlodipine for further evaluation and management.     ED Prescriptions     Medication Sig Dispense Auth. Provider   triamcinolone cream (KENALOG) 0.1 % Apply 1 application topically 2 (two) times daily. Limit use to 1 week 30 g Lance Muss, FNP      PDMP not reviewed this encounter.   Lance Muss, FNP 04/16/21 [phone]

## 2021-04-16 NOTE — Discharge Instructions (Addendum)
You have been prescribed triamcinolone cream to treat allergic reaction to hand.  Please follow-up with provider who prescribed amlodipine for further evaluation and management.

## 2021-04-16 NOTE — ED Triage Notes (Signed)
Pt here for rash to right hand that is not itchy or painful; pt unsure if could be from new BP meds
